# Patient Record
Sex: Female | Born: 1990 | Race: Black or African American | Hispanic: No | Marital: Single | Smoking: Never smoker
Health system: Southern US, Community
[De-identification: ages and names within clinical notes are randomized; demographics above are authoritative.]

## PROBLEM LIST (undated history)

## (undated) DIAGNOSIS — B029 Zoster without complications: Secondary | ICD-10-CM

## (undated) DIAGNOSIS — N390 Urinary tract infection, site not specified: Secondary | ICD-10-CM

## (undated) DIAGNOSIS — E669 Obesity, unspecified: Secondary | ICD-10-CM

## (undated) DIAGNOSIS — E282 Polycystic ovarian syndrome: Secondary | ICD-10-CM

---
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
## 2010-03-28 LAB — URINE MICROSCOPIC-ADD ON

## 2010-03-28 LAB — URINALYSIS, ROUTINE W REFLEX MICROSCOPIC
Glucose, UA: NEGATIVE mg/dL
Hgb urine dipstick: NEGATIVE
Specific Gravity, Urine: 1.02 (ref 1.005–1.030)

## 2010-04-19 LAB — URINALYSIS, ROUTINE W REFLEX MICROSCOPIC
Bilirubin Urine: NEGATIVE
Glucose, UA: NEGATIVE mg/dL
Hgb urine dipstick: NEGATIVE
Ketones, ur: NEGATIVE mg/dL
Nitrite: NEGATIVE
Nitrite: NEGATIVE
Protein, ur: NEGATIVE mg/dL
Urobilinogen, UA: 0.2 mg/dL (ref 0.0–1.0)
pH: 7.5 (ref 5.0–8.0)

## 2010-04-19 LAB — PREGNANCY, URINE
Preg Test, Ur: NEGATIVE
Preg Test, Ur: NEGATIVE

## 2010-04-19 LAB — URINE MICROSCOPIC-ADD ON

## 2010-06-06 ENCOUNTER — Emergency Department (HOSPITAL_BASED_OUTPATIENT_CLINIC_OR_DEPARTMENT_OTHER)
Admission: EM | Admit: 2010-06-06 | Discharge: 2010-06-06 | Disposition: A | Discharge status: Home / Self Care | Payer: Managed Care, Other (non HMO) | Attending: Emergency Medicine | Admitting: Emergency Medicine

## 2010-06-06 DIAGNOSIS — H9209 Otalgia, unspecified ear: Secondary | ICD-10-CM | POA: Insufficient documentation

## 2010-06-06 DIAGNOSIS — H6690 Otitis media, unspecified, unspecified ear: Secondary | ICD-10-CM | POA: Insufficient documentation

## 2011-03-24 ENCOUNTER — Encounter (HOSPITAL_BASED_OUTPATIENT_CLINIC_OR_DEPARTMENT_OTHER): Payer: Self-pay | Admitting: *Deleted

## 2011-03-24 ENCOUNTER — Emergency Department (HOSPITAL_BASED_OUTPATIENT_CLINIC_OR_DEPARTMENT_OTHER)
Admission: EM | Admit: 2011-03-24 | Discharge: 2011-03-24 | Disposition: A | Discharge status: Home / Self Care | Payer: Managed Care, Other (non HMO) | Attending: Emergency Medicine | Admitting: Emergency Medicine

## 2011-03-24 DIAGNOSIS — R599 Enlarged lymph nodes, unspecified: Secondary | ICD-10-CM | POA: Insufficient documentation

## 2011-03-24 DIAGNOSIS — R591 Generalized enlarged lymph nodes: Secondary | ICD-10-CM

## 2011-03-24 MED ORDER — CEPHALEXIN 500 MG PO CAPS: 500.0000 mg | ORAL_CAPSULE | Freq: Four times a day (QID) | ORAL | Status: AC

## 2011-03-24 NOTE — Discharge Instructions (Signed)
Cervical Adenitis You have a swollen lymph gland in your neck. This commonly happens with Strep and virus infections, dental problems, insect bites, and injuries about the face, scalp, or neck. The lymph glands swell as the body fights the infection or heals the injury. Swelling and firmness typically lasts for several weeks after the infection or injury is healed. Rarely lymph glands can become swollen because of cancer or TB. Antibiotics are prescribed if there is evidence of an infection. Sometimes an infected lymph gland becomes filled with pus. This condition may require opening up the abscessed gland by draining it surgically. Most of the time infected glands return to normal within two weeks. Do not poke or squeeze the swollen lymph nodes. That may keep them from shrinking back to their normal size. If the lymph gland is still swollen after 2 weeks, further medical evaluation is needed.  SEEK IMMEDIATE MEDICAL CARE IF:  You have difficulty swallowing or breathing, increased swelling, severe pain, or a high fever.  Document Released: 12/26/2004 Document Revised: 12/15/2010 Document Reviewed: 06/17/2006 [employer] Patient Information 2012 [employer].Lymphadenopathy Lymphadenopathy means "disease of the lymph glands." But the term is usually used to describe swollen or enlarged lymph glands, also called lymph nodes. These are the bean-shaped organs found in many locations including the neck, underarm, and groin. Lymph glands are part of the immune system, which fights infections in your body. Lymphadenopathy can occur in just one area of the body, such as the neck, or it can be generalized, with lymph node enlargement in several areas. The nodes found in the neck are the most common sites of lymphadenopathy. CAUSES  When your immune system responds to germs (such as viruses or bacteria ), infection-fighting cells and fluid build up. This causes the glands to grow in size. This is usually not  something to worry about. Sometimes, the glands themselves can become infected and inflamed. This is called lymphadenitis. Enlarged lymph nodes can be caused by many diseases:  Bacterial disease, such as strep throat or a skin infection.   Viral disease, such as a common cold.   Other germs, such as lyme disease, tuberculosis, or sexually transmitted diseases.   Cancers, such as lymphoma (cancer of the lymphatic system) or leukemia (cancer of the white blood cells).   Inflammatory diseases such as lupus or rheumatoid arthritis.   Reactions to medications.  Many of the diseases above are rare, but important. This is why you should see your caregiver if you have lymphadenopathy. SYMPTOMS   Swollen, enlarged lumps in the neck, back of the head or other locations.   Tenderness.   Warmth or redness of the skin over the lymph nodes.   Fever.  DIAGNOSIS  Enlarged lymph nodes are often near the source of infection. They can help healthcare providers diagnose your illness. For instance:   Swollen lymph nodes around the jaw might be caused by an infection in the mouth.   Enlarged glands in the neck often signal a throat infection.   Lymph nodes that are swollen in more than one area often indicate an illness caused by a virus.  Your caregiver most likely will know what is causing your lymphadenopathy after listening to your history and examining you. Blood tests, x-rays or other tests may be needed. If the cause of the enlarged lymph node cannot be found, and it does not go away by itself, then a biopsy may be needed. Your caregiver will discuss this with you. TREATMENT  Treatment for your   enlarged lymph nodes will depend on the cause. Many times the nodes will shrink to normal size by themselves, with no treatment. Antibiotics or other medicines may be needed for infection. Only take over-the-counter or prescription medicines for pain, discomfort or fever as directed by your caregiver. HOME  CARE INSTRUCTIONS  Swollen lymph glands usually return to normal when the underlying medical condition goes away. If they persist, contact your health-care provider. He/she might prescribe antibiotics or other treatments, depending on the diagnosis. Take any medications exactly as prescribed. Keep any follow-up appointments made to check on the condition of your enlarged nodes.  SEEK MEDICAL CARE IF:   Swelling lasts for more than two weeks.   You have symptoms such as weight loss, night sweats, fatigue or fever that does not go away.   The lymph nodes are hard, seem fixed to the skin or are growing rapidly.   Skin over the lymph nodes is red and inflamed. This could mean there is an infection.  SEEK IMMEDIATE MEDICAL CARE IF:   Fluid starts leaking from the area of the enlarged lymph node.   You develop a fever of 102 F (38.9 C) or greater.   Severe pain develops (not necessarily at the site of a large lymph node).   You develop chest pain or shortness of breath.   You develop worsening abdominal pain.  MAKE SURE YOU:   Understand these instructions.   Will watch your condition.   Will get help right away if you are not doing well or get worse.  Document Released: 10/05/2007 Document Revised: 12/15/2010 Document Reviewed: 10/05/2007 [employer] Patient Information 2012 [employer]. 

## 2011-03-24 NOTE — ED Provider Notes (Signed)
History     CSN: [id number]  Arrival date & time 03/24/11  4098   First MD Initiated Contact with Patient 03/24/11 0246      Chief Complaint  Patient presents with  . knots behind left ear     (Consider location/radiation/quality/duration/timing/severity/associated sxs/prior treatment) HPI Comments: Patient was at work today when she felt too painful nodules behind her left ear. The pain as a 4/10 and worse with palpation. It does not radiate. Today is the first day she noticed these areas. She denies recent sore throat, cough, fever or other upper respiratory symptoms. She states she otherwise feels fine.  No prior medical history and she takes no medications.  The history is provided by the patient.    History reviewed. No pertinent past medical history.  History reviewed. No pertinent past surgical history.  History reviewed. No pertinent family history.  History  Substance Use Topics  . Smoking status: Never Smoker   . Smokeless tobacco: Not on file  . Alcohol Use: No    OB History    Grav Para Term Preterm Abortions TAB SAB Ect Mult Living                  Review of Systems  Constitutional: Negative for fever, fatigue and unexpected weight change.  HENT: Negative for ear pain, congestion, sore throat, rhinorrhea, mouth sores and dental problem.   Respiratory: Negative for cough.   Cardiovascular: Negative for chest pain.  Gastrointestinal: Negative for nausea, vomiting and diarrhea.  Genitourinary: Negative for dysuria.  All other systems reviewed and are negative.    Allergies  Review of patient's allergies indicates not on file.  Home Medications  No current outpatient prescriptions on file.  BP 162/83  Pulse 58  Temp(Src) 98.3 F (36.8 C) (Oral)  Resp 18  SpO2 100%  LMP 01/24/2011  Physical Exam  Nursing note and vitals reviewed. Constitutional: She is oriented to person, place, and time. She appears well-developed and well-nourished. No  distress.  HENT:  Head: Normocephalic and atraumatic.    Right Ear: Tympanic membrane and ear canal normal.  Left Ear: Tympanic membrane and ear canal normal.  Mouth/Throat: Oropharynx is clear and moist and mucous membranes are normal. No oropharyngeal exudate, posterior oropharyngeal edema or posterior oropharyngeal erythema.  Eyes: EOM are normal. Pupils are equal, round, and reactive to light.  Neck: Normal range of motion. Neck supple.  Cardiovascular: Normal rate, normal heart sounds and intact distal pulses.   Pulmonary/Chest: Effort normal and breath sounds normal. No respiratory distress. She has no wheezes. She has no rales.  Musculoskeletal: She exhibits no edema and no tenderness.       No axillary or supraclavicular nodes palpated  Lymphadenopathy:    She has no cervical adenopathy.  Neurological: She is alert and oriented to person, place, and time.  Skin: Skin is warm and dry. No rash noted. No erythema.    ED Course  Procedures (including critical care time)  Labs Reviewed - No data to display No results found.   1. Lymphadenopathy       MDM   Patient with lymphadenopathy in the postauricular region without axillary or supraclavicular nodes present. Patient has no other symptoms. No signs of otitis or pharyngitis. Patient denies recent cough or cold. Will give Keflex and have the patient followup if nodes do not resolve in 2 weeks.        Gwyneth Sprout, MD 03/24/11 [phone]

## 2011-03-24 NOTE — ED Notes (Signed)
Pt states that she noticed two knots behind left ear today knots are painful to touch denies recent illness

## 2011-05-24 ENCOUNTER — Emergency Department (HOSPITAL_BASED_OUTPATIENT_CLINIC_OR_DEPARTMENT_OTHER)
Admission: EM | Admit: 2011-05-24 | Discharge: 2011-05-24 | Disposition: A | Discharge status: Home / Self Care | Payer: Managed Care, Other (non HMO) | Attending: Emergency Medicine | Admitting: Emergency Medicine

## 2011-05-24 ENCOUNTER — Encounter (HOSPITAL_BASED_OUTPATIENT_CLINIC_OR_DEPARTMENT_OTHER): Payer: Self-pay | Admitting: *Deleted

## 2011-05-24 DIAGNOSIS — H109 Unspecified conjunctivitis: Secondary | ICD-10-CM | POA: Insufficient documentation

## 2011-05-24 MED ORDER — CIPROFLOXACIN HCL 0.3 % OP SOLN
2.0000 [drp] | OPHTHALMIC | Status: DC
  Administered 2011-05-24: 2 [drp] via OPHTHALMIC
  Filled 2011-05-24: qty 2.5

## 2011-05-24 NOTE — ED Notes (Signed)
Pt c/o bil eye redness  X 2 days, hx contact use

## 2011-05-24 NOTE — Discharge Instructions (Signed)
Conjunctivitis Conjunctivitis is commonly called "pink eye." Conjunctivitis can be caused by bacterial or viral infection, allergies, or injuries. There is usually redness of the lining of the eye, itching, discomfort, and sometimes discharge. There may be deposits of matter along the eyelids. A viral infection usually causes a watery discharge, while a bacterial infection causes a yellowish, thick discharge. Pink eye is very contagious and spreads by direct contact. You may be given antibiotic eyedrops as part of your treatment. Before using your eye medicine, remove all drainage from the eye by washing gently with warm water and cotton balls. Continue to use the medication until you have awakened 2 mornings in a row without discharge from the eye. Do not rub your eye. This increases the irritation and helps spread infection. Use separate towels from other household members. Wash your hands with soap and water before and after touching your eyes. Use cold compresses to reduce pain and sunglasses to relieve irritation from light. Do not wear contact lenses or wear eye makeup until the infection is gone. SEEK MEDICAL CARE IF:   Your symptoms are not better after 3 days of treatment.   You have increased pain or trouble seeing.   The outer eyelids become very red or swollen.  Document Released: 02/03/2004 Document Revised: 12/15/2010 Document Reviewed: 12/26/2004 [employer] Patient Information 2012 [employer]. 

## 2011-05-24 NOTE — ED Provider Notes (Signed)
History     CSN: [id number]  Arrival date & time 05/24/11  1502   First MD Initiated Contact with Patient 05/24/11 1513      Chief Complaint  Patient presents with  . Eye Problem    (Consider location/radiation/quality/duration/timing/severity/associated sxs/prior treatment) HPI Pt reports onset of bilateral itching watery eyes last night, associated with drainage and crusting while asleep. She wears contact lenses, but has not had them in since last night. No acute visual changes, no trauma. She is otherwise feeling well.   History reviewed. No pertinent past medical history.  History reviewed. No pertinent past surgical history.  History reviewed. No pertinent family history.  History  Substance Use Topics  . Smoking status: Never Smoker   . Smokeless tobacco: Not on file  . Alcohol Use: No    OB History    Grav Para Term Preterm Abortions TAB SAB Ect Mult Living                  Review of Systems No fever, URI symptoms  Allergies  Review of patient's allergies indicates no known allergies.  Home Medications  No current outpatient prescriptions on file.  BP 156/90  Pulse 88  Temp(Src) 98.7 F (37.1 C) (Oral)  Resp 18  Ht 5\' 4"  (1.626 m)  Wt 220 lb (99.791 kg)  BMI 37.76 kg/m2  SpO2 99%  Physical Exam  Constitutional: She is oriented to person, place, and time. She appears well-developed and well-nourished.  HENT:  Head: Normocephalic and atraumatic.  Eyes: Pupils are equal, round, and reactive to light. Right eye exhibits discharge. Left eye exhibits discharge.       Bilateral conjunctival injection and chemosis, anterior chamber is clear bilaterally  Neck: Neck supple.  Pulmonary/Chest: Effort normal.  Neurological: She is alert and oriented to person, place, and time. No cranial nerve deficit.  Psychiatric: She has a normal mood and affect. Her behavior is normal.    ED Course  Procedures (including critical care time)  Labs Reviewed - No  data to display No results found.   No diagnosis found.    MDM  Pt with conjunctivitis, contact lens wearer, given Cipro drops, advised to throw away current contacts and not wear them until symptoms have cleared.         Hollan Philipp B. Bernette Mayers, MD 05/24/11 [phone]

## 2011-06-28 ENCOUNTER — Emergency Department (HOSPITAL_BASED_OUTPATIENT_CLINIC_OR_DEPARTMENT_OTHER)
Admission: EM | Admit: 2011-06-28 | Discharge: 2011-06-29 | Disposition: A | Discharge status: Home / Self Care | Payer: Managed Care, Other (non HMO) | Attending: Emergency Medicine | Admitting: Emergency Medicine

## 2011-06-28 ENCOUNTER — Encounter (HOSPITAL_BASED_OUTPATIENT_CLINIC_OR_DEPARTMENT_OTHER): Payer: Self-pay | Admitting: *Deleted

## 2011-06-28 DIAGNOSIS — B379 Candidiasis, unspecified: Secondary | ICD-10-CM

## 2011-06-28 DIAGNOSIS — N760 Acute vaginitis: Secondary | ICD-10-CM | POA: Insufficient documentation

## 2011-06-28 DIAGNOSIS — B9689 Other specified bacterial agents as the cause of diseases classified elsewhere: Secondary | ICD-10-CM | POA: Insufficient documentation

## 2011-06-28 DIAGNOSIS — A499 Bacterial infection, unspecified: Secondary | ICD-10-CM | POA: Insufficient documentation

## 2011-06-28 NOTE — ED Notes (Signed)
Pt c/o ? Yeast infection x 2 days

## 2011-06-29 LAB — WET PREP, GENITAL: Trich, Wet Prep: NONE SEEN

## 2011-06-29 LAB — URINALYSIS, ROUTINE W REFLEX MICROSCOPIC
Bilirubin Urine: NEGATIVE
Glucose, UA: NEGATIVE mg/dL
Hgb urine dipstick: NEGATIVE
Ketones, ur: NEGATIVE mg/dL
Specific Gravity, Urine: 1.028 (ref 1.005–1.030)
pH: 6.5 (ref 5.0–8.0)

## 2011-06-29 MED ORDER — METRONIDAZOLE 500 MG PO TABS: 500.0000 mg | ORAL_TABLET | Freq: Two times a day (BID) | ORAL | Status: AC

## 2011-06-29 MED ORDER — FLUCONAZOLE 150 MG PO TABS: 150.0000 mg | ORAL_TABLET | Freq: Once | ORAL | Status: AC

## 2011-06-29 NOTE — ED Notes (Signed)
MD at bedside. 

## 2011-06-29 NOTE — ED Provider Notes (Signed)
History     CSN: [id number]  Arrival date & time 06/28/11  2351   First MD Initiated Contact with Patient 06/29/11 0127      Chief Complaint  Patient presents with  . Vaginal Discharge    (Consider location/radiation/quality/duration/timing/severity/associated sxs/prior treatment) HPI  C/o vaginal discharge x 2 days. Itching and white. Denies foul odor. Sexually active in monogamous relationship. Denies h/o STI. Denies abdominal pain, back pain. Denies hematuria/dysuria/freq/urgency. Denies fever/chills. No other complaints today.   ED Notes, ED Provider Notes from 06/28/11 0000 to 06/28/11 23:56:44       Darlene Duos, RN 06/28/2011 23:55      Pt c/o ? Yeast infection x 2 days    History reviewed. No pertinent past medical history.  History reviewed. No pertinent past surgical history.  History reviewed. No pertinent family history.  History  Substance Use Topics  . Smoking status: Never Smoker   . Smokeless tobacco: Not on file  . Alcohol Use: No    OB History    Grav Para Term Preterm Abortions TAB SAB Ect Mult Living                  Review of Systems  All other systems reviewed and are negative.   except as noted HPI   Allergies  Shellfish allergy  Home Medications   Current Outpatient Rx  Name Route Sig Dispense Refill  . FLUCONAZOLE 150 MG PO TABS Oral Take 1 tablet (150 mg total) by mouth once. 1 tablet 1  . METRONIDAZOLE 500 MG PO TABS Oral Take 1 tablet (500 mg total) by mouth 2 (two) times daily. 14 tablet 0    BP 155/74  Pulse 78  Temp 98.1 F (36.7 C) (Oral)  Resp 18  Ht 5\' 4"  (1.626 m)  Wt 220 lb (99.791 kg)  BMI 37.76 kg/m2  SpO2 100%  LMP 04/26/2011  Physical Exam  Nursing note and vitals reviewed. Constitutional: She is oriented to person, place, and time. She appears well-developed.  HENT:  Head: Atraumatic.  Mouth/Throat: Oropharynx is clear and moist.  Eyes: Conjunctivae and EOM are normal. Pupils are equal,  round, and reactive to light.  Neck: Normal range of motion. Neck supple.  Cardiovascular: Normal rate, regular rhythm, normal heart sounds and intact distal pulses.   Pulmonary/Chest: Effort normal and breath sounds normal. No respiratory distress. She has no wheezes. She has no rales.  Abdominal: Soft. She exhibits no distension. There is no tenderness. There is no rebound and no guarding.  Genitourinary: Vaginal discharge found.       White vaginal discharge No CMT  Musculoskeletal: Normal range of motion.  Neurological: She is alert and oriented to person, place, and time.  Skin: Skin is warm and dry. No rash noted.  Psychiatric: She has a normal mood and affect.    ED Course  Procedures (including critical care time)  Labs Reviewed  URINALYSIS, ROUTINE W REFLEX MICROSCOPIC - Abnormal; Notable for the following:    APPearance CLOUDY (*)     Leukocytes, UA LARGE (*)     All other components within normal limits  WET PREP, GENITAL - Abnormal; Notable for the following:    Yeast Wet Prep HPF POC FEW (*)     Clue Cells Wet Prep HPF POC FEW (*)     WBC, Wet Prep HPF POC TOO NUMEROUS TO COUNT (*)  MANY BACTERIA SEEN   All other components within normal limits  URINE MICROSCOPIC-ADD ON - Abnormal;  Notable for the following:    Bacteria, UA MANY (*)     All other components within normal limits  PREGNANCY, URINE  GC/CHLAMYDIA PROBE AMP, GENITAL   No results found.   1. Vaginitis   2. BV (bacterial vaginosis)   3. Yeast infection       MDM  Vaginal discharge. She has both yeast and clue cells on her wet prep. We'll treat for both. Urinalysis appears consistent with urinary tract infection however patient has no signs or symptoms suggesting urinary tract infection given the significant amount of vaginal discharge that she's having I believe a sample to be contaminated seen. She does not have a significant amount of squamous cells. She given prescription for fluconazole, Flagyl.  No EMC precluding discharge at this time. Given Precautions for return. PMD f/u.         Forbes Cellar, MD 06/29/11 [phone]

## 2011-06-29 NOTE — Discharge Instructions (Signed)
Bacterial Vaginosis Bacterial vaginosis (BV) is a vaginal infection where the normal balance of bacteria in the vagina is disrupted. The normal balance is then replaced by an overgrowth of certain bacteria. There are several different kinds of bacteria that can cause BV. BV is the most common vaginal infection in women of childbearing age. CAUSES   The cause of BV is not fully understood. BV develops when there is an increase or imbalance of harmful bacteria.   Some activities or behaviors can upset the normal balance of bacteria in the vagina and put women at increased risk including:   Having a new sex partner or multiple sex partners.   Douching.   Using an intrauterine device (IUD) for contraception.   It is not clear what role sexual activity plays in the development of BV. However, women that have never had sexual intercourse are rarely infected with BV.  Women do not get BV from toilet seats, bedding, swimming pools or from touching objects around them.  SYMPTOMS   Grey vaginal discharge.   A fish-like odor with discharge, especially after sexual intercourse.   Itching or burning of the vagina and vulva.   Burning or pain with urination.   Some women have no signs or symptoms at all.  DIAGNOSIS  Your caregiver must examine the vagina for signs of BV. Your caregiver will perform lab tests and look at the sample of vaginal fluid through a microscope. They will look for bacteria and abnormal cells (clue cells), a pH test higher than 4.5, and a positive amine test all associated with BV.  RISKS AND COMPLICATIONS   Pelvic inflammatory disease (PID).   Infections following gynecology surgery.   Developing HIV.   Developing herpes virus.  TREATMENT  Sometimes BV will clear up without treatment. However, all women with symptoms of BV should be treated to avoid complications, especially if gynecology surgery is planned. Female partners generally do not need to be treated. However,  BV may spread between female sex partners so treatment is helpful in preventing a recurrence of BV.   BV may be treated with antibiotics. The antibiotics come in either pill or vaginal cream forms. Either can be used with nonpregnant or pregnant women, but the recommended dosages differ. These antibiotics are not harmful to the baby.   BV can recur after treatment. If this happens, a second round of antibiotics will often be prescribed.   Treatment is important for pregnant women. If not treated, BV can cause a premature delivery, especially for a pregnant woman who had a premature birth in the past. All pregnant women who have symptoms of BV should be checked and treated.   For chronic reoccurrence of BV, treatment with a type of prescribed gel vaginally twice a week is helpful.  HOME CARE INSTRUCTIONS   Finish all medication as directed by your caregiver.   Do not have sex until treatment is completed.   Tell your sexual partner that you have a vaginal infection. They should see their caregiver and be treated if they have problems, such as a mild rash or itching.   Practice safe sex. Use condoms. Only have 1 sex partner.  PREVENTION  Basic prevention steps can help reduce the risk of upsetting the natural balance of bacteria in the vagina and developing BV:  Do not have sexual intercourse (be abstinent).   Do not douche.   Use all of the medicine prescribed for treatment of BV, even if the signs and symptoms go away.     Tell your sex partner if you have BV. That way, they can be treated, if needed, to prevent reoccurrence.  SEEK MEDICAL CARE IF:   Your symptoms are not improving after 3 days of treatment.   You have increased discharge, pain, or fever.  MAKE SURE YOU:   Understand these instructions.   Will watch your condition.   Will get help right away if you are not doing well or get worse.  FOR MORE INFORMATION  Division of STD Prevention (DSTDP), Centers for Disease  Control and Prevention: SolutionApps.co.za American Social Health Association (ASHA): www.ashastd.org  Document Released: 12/26/2004 Document Revised: 12/15/2010 Document Reviewed: 06/18/2008 [facility] Patient Information 2012 [place], [place].  Vaginitis Vaginitis in a soreness, swelling and redness (inflammation) of the vagina and vulva. This is not a sexually transmitted infection.  CAUSES  Yeast vaginitis is caused by yeast (candida) that is normally found in your vagina. With a yeast infection, the candida has over grown in number to a point that upsets the chemical balance. SYMPTOMS   White thick vaginal discharge.   Swelling, itching, redness and irritation of the vagina and possibly the lips of the vagina (vulva).   Burning or painful urination.   Painful intercourse.  HOME CARE INSTRUCTIONS   Finish all medication as prescribed.   Do not have sex until treatment is completed or instructed by your healthcare giver.   Take warm sitz baths.   Do not douche.   Do not use tampons, especially scented ones.   Wear cotton underwear.   Avoid tight pants and panty hose.   Tell your sexual partner that you have a yeast infection. They should go to their caregiver if they have symptoms such as mild rash or itching.   Your sexual partner should be treated if your infection is difficult to eliminate.   Practice safer sex. Use condoms.   Some vaginal medications cause latex condoms to fail. Ask your caregiver this.  SEEK MEDICAL CARE IF:   You develop a fever.   The infection is getting worse after 2 days of treatment.   The infection is not getting better after 3 days of treatment.   You develop blisters in or around your vagina.   You develop vaginal bleeding, and it is not your menstrual period.   You have pain when you urinate.   You develop intestinal problems.   You have pain with sexual intercourse.  Document Released: 02/03/2004 Document Revised: 12/15/2010  Document Reviewed: 09/10/2008 [facility] Patient Information 2012 [place], [place].  RESOURCE GUIDE  Dental Problems  Patients with Medicaid: [facility] [id number]                                           [phone] [address] Phone:  [phone]                                                   Phone:  [phone]  If unable to pay or uninsured, contact:  [facility] or [facility]. to become qualified for the adult dental clinic.  Chronic Pain Problems  Contact [facility]  [phone] Patients need to be referred by their primary care doctor.  Insufficient Money for Medicine Contact United Way:  call "211" or [facility] [phone].  No Primary Care Doctor Call [facility]  [phone] Other agencies that provide inexpensive medical care    [facility]  [phone]    [facility]  [phone]    [facility]  [phone]    [facility]  [phone]    [facility]  [phone]    [facility]  [phone]  Psychological Services [facility]  [phone] [facility]  [phone] [facility]   [phone] (emergency services [phone])  Abuse/Neglect [facility] Child Abuse Hotline 519-866-0108 [facility] Child Abuse Hotline [phone] (After Hours)  Emergency Shelter [facility] [phone]  Maternity Homes [facility] at [facility] [place] [facility] [phone] [facility] [phone]  MRSA Hotline #:   (442)356-0636    [facility] Resources  [facility]  United Way                           [facility] Dept. [address] [place]                     [address]         [address]  Blondell Reveal Phone:  [phone]                                   Phone:  [phone]                   Phone:  [phone]  [facility] Phone:  [phone]  [facility] Child Abuse Hotline [phone] (After Hours)

## 2011-06-29 NOTE — ED Notes (Signed)
Pelvic cart to bedside 

## 2011-06-30 LAB — GC/CHLAMYDIA PROBE AMP, GENITAL
Chlamydia, DNA Probe: NEGATIVE
GC Probe Amp, Genital: NEGATIVE

## 2011-11-08 ENCOUNTER — Emergency Department (HOSPITAL_BASED_OUTPATIENT_CLINIC_OR_DEPARTMENT_OTHER)
Admission: EM | Admit: 2011-11-08 | Discharge: 2011-11-08 | Disposition: A | Discharge status: Home / Self Care | Payer: Self-pay | Attending: Emergency Medicine | Admitting: Emergency Medicine

## 2011-11-08 ENCOUNTER — Encounter (HOSPITAL_BASED_OUTPATIENT_CLINIC_OR_DEPARTMENT_OTHER): Payer: Self-pay | Admitting: *Deleted

## 2011-11-08 DIAGNOSIS — N644 Mastodynia: Secondary | ICD-10-CM | POA: Insufficient documentation

## 2011-11-08 DIAGNOSIS — Z3202 Encounter for pregnancy test, result negative: Secondary | ICD-10-CM | POA: Insufficient documentation

## 2011-11-08 LAB — PREGNANCY, URINE: Preg Test, Ur: NEGATIVE

## 2011-11-08 MED ORDER — IBUPROFEN 800 MG PO TABS
800.0000 mg | ORAL_TABLET | Freq: Once | ORAL | Status: AC
  Administered 2011-11-08: 800 mg via ORAL
  Filled 2011-11-08: qty 1

## 2011-11-08 MED ORDER — IBUPROFEN 800 MG PO TABS: 800.0000 mg | ORAL_TABLET | Freq: Three times a day (TID) | ORAL | Status: DC

## 2011-11-08 NOTE — ED Provider Notes (Signed)
Medical screening examination/treatment/procedure(s) were performed by non-physician practitioner and as supervising physician I was immediately available for consultation/collaboration.  Geoffery Lyons, MD 11/08/11 [phone]

## 2011-11-08 NOTE — ED Notes (Signed)
Pt c/o bil breast pain x  3 months

## 2011-11-08 NOTE — ED Notes (Signed)
Pt c/o bilateral breast pain x 3 months. Pt reports pain is worse on sides of breast. Pt denies any discharge. No abnormalities noted on assessment.

## 2011-11-08 NOTE — ED Provider Notes (Signed)
History     CSN: [id number]  Arrival date & time 11/08/11  1318   First MD Initiated Contact with Patient 11/08/11 1330      Chief Complaint  Patient presents with  . breast pain     (Consider location/radiation/quality/duration/timing/severity/associated sxs/prior treatment) HPI Comments: Patient is a 21 year old female who presents with a 3 month history of intermittent bilateral breast pain. The pain is located in both breasts and does radiate to her midaxillary line and underneath her breasts. The pain is described as aching and moderate. The pain starts suddenly and spontaneously resolves without intervention. No alleviating/aggravating factors. The patient has tried nothing for symptoms without relief. Associated symptoms include nothing. Patient denies current breast feeding. Patient denies fever, headache, NVD, chest pain, SOB, dysuria, constipation, nipple discharge, breast mass, breast skin changes, abnormal vaginal bleeding/discharge. LMP 09/25/2011    History reviewed. No pertinent past medical history.  History reviewed. No pertinent past surgical history.  History reviewed. No pertinent family history.  History  Substance Use Topics  . Smoking status: Never Smoker   . Smokeless tobacco: Not on file  . Alcohol Use: No    OB History    Grav Para Term Preterm Abortions TAB SAB Ect Mult Living                  Review of Systems  Constitutional:       Breast pain.   All other systems reviewed and are negative.    Allergies  Shellfish allergy  Home Medications  No current outpatient prescriptions on file.  BP 155/65  Pulse 78  Temp 98.9 F (37.2 C) (Oral)  Resp 16  Ht 5\' 5"  (1.651 m)  Wt 230 lb (104.327 kg)  BMI 38.27 kg/m2  SpO2 100%  LMP 09/25/2011  Physical Exam  Nursing note and vitals reviewed. Constitutional: She is oriented to person, place, and time. She appears well-developed and well-nourished. No distress.  HENT:  Head:  Normocephalic and atraumatic.  Nose: Nose normal.  Eyes: Conjunctivae normal and EOM are normal. Pupils are equal, round, and reactive to light. No scleral icterus.  Neck: Normal range of motion. Neck supple.  Cardiovascular: Normal rate and regular rhythm.  Exam reveals no gallop and no friction rub.   No murmur heard. Pulmonary/Chest: Effort normal and breath sounds normal. She has no wheezes. She has no rales. She exhibits tenderness.       Bilateral breasts mildly tender to palpation on exam. No abnormal lumps or masses palpated. Breasts equal in size and texture bilaterally. No nipple discharge noted. No enlarged lymph nodes palpated.   Abdominal: Soft. She exhibits no distension. There is no tenderness. There is no rebound and no guarding.  Musculoskeletal: Normal range of motion.  Neurological: She is alert and oriented to person, place, and time. Coordination normal.       Speech is goal-oriented. Moves limbs without ataxia.   Skin: Skin is warm and dry. She is not diaphoretic.  Psychiatric: She has a normal mood and affect. Her behavior is normal.    ED Course  Procedures (including critical care time)   Labs Reviewed  PREGNANCY, URINE   No results found.   1. Breast pain       MDM  1:57 PM Urine preg negative. Patient having intermittent breast tenderness unrelated to her menstrual cycle. Patient unsure of family history of breast cancer. No concerning findings on exam. Patient will have ibuprofen here and go home with a prescription  for ibuprofen and follow up with OBGYN. No further evaluation needed here at this time.        Emilia Beck, PA-C 11/08/11 1407

## 2012-05-24 ENCOUNTER — Encounter (HOSPITAL_BASED_OUTPATIENT_CLINIC_OR_DEPARTMENT_OTHER): Payer: Self-pay | Admitting: *Deleted

## 2012-05-24 ENCOUNTER — Emergency Department (HOSPITAL_BASED_OUTPATIENT_CLINIC_OR_DEPARTMENT_OTHER)
Admission: EM | Admit: 2012-05-24 | Discharge: 2012-05-24 | Disposition: A | Discharge status: Home / Self Care | Payer: No Typology Code available for payment source | Attending: Emergency Medicine | Admitting: Emergency Medicine

## 2012-05-24 ENCOUNTER — Emergency Department (HOSPITAL_BASED_OUTPATIENT_CLINIC_OR_DEPARTMENT_OTHER): Payer: No Typology Code available for payment source

## 2012-05-24 DIAGNOSIS — M79602 Pain in left arm: Secondary | ICD-10-CM

## 2012-05-24 DIAGNOSIS — M79609 Pain in unspecified limb: Secondary | ICD-10-CM | POA: Insufficient documentation

## 2012-05-24 DIAGNOSIS — R0789 Other chest pain: Secondary | ICD-10-CM | POA: Insufficient documentation

## 2012-05-24 DIAGNOSIS — R209 Unspecified disturbances of skin sensation: Secondary | ICD-10-CM | POA: Insufficient documentation

## 2012-05-24 LAB — TROPONIN I: Troponin I: <0.3 ng/mL (ref <0.30)

## 2012-05-24 MED ORDER — CYCLOBENZAPRINE HCL 10 MG PO TABS: 10.0000 mg | ORAL_TABLET | Freq: Two times a day (BID) | ORAL | Status: DC | PRN

## 2012-05-24 NOTE — ED Notes (Signed)
Patient states she has a three day history of intermittent left hand and finger tingling.  States when she lays on her right side, she has left chest pain.  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

## 2012-05-24 NOTE — ED Provider Notes (Signed)
12:22 PM  Date: 05/24/2012  Rate: 65  Rhythm: normal sinus rhythm and sinus arrhythmia  QRS Axis: normal  Intervals: normal  ST/T Wave abnormalities: normal  Conduction Disutrbances:none  Narrative Interpretation: Normal EKG  Old EKG Reviewed: none available    Carleene Cooper III, MD 05/24/12 1223

## 2012-05-24 NOTE — ED Provider Notes (Signed)
History     CSN: [id number]  Arrival date & time 05/24/12  1153   First MD Initiated Contact with Patient 05/24/12 1314      Chief Complaint  Patient presents with  . Arm Pain    (Consider location/radiation/quality/duration/timing/severity/associated sxs/prior treatment) HPI Comments: Darlene Ayala is a 22 y/o F presenting to the ED with left arm pain. 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 Described discomfort to be of a "dead arm," stated that the arm feels heavy and aching, occurs intermittently with episodes lasting 2 minutes and occurring every 15 minutes. Patient reported that she has been experiencing left hand tingling every 15 minutes that last approximately 2 minutes. Stated that she pciked up a heavy patient approximately 2 weeks ago - thinks that it might be from that incident. Patient reported that when she lays on her right side she has chest pain that is localized to the left side of the chest - described as an aching pain that is intermittent and associated with left arm discomfort. Patient reported that nothing makes the discomfort better, nothing makes the discomfort worse. Denied weakness to the arm and being unable to hold objects. Denied neck pain, back pain, shoulder pain, injury, trauma, headache, dizziness, fatigue, shortness of breathe, difficulty breathing.     Patient is a 22 y.o. female presenting with arm pain. The history is provided by the patient. No language interpreter was used.  Arm Pain Associated symptoms include arthralgias (left arm discomfort) and chest pain. Pertinent negatives include no abdominal pain, chills, coughing, fatigue, fever, headaches, nausea, neck pain, numbness, rash, sore throat, vomiting or weakness.    History reviewed. No pertinent past medical history.  History reviewed. No pertinent past surgical history.  No family history  on file.  History  Substance Use Topics  . Smoking status: Never Smoker   . Smokeless tobacco: Not on file  . Alcohol Use: No    OB History   Grav Para Term Preterm Abortions TAB SAB Ect Mult Living                  Review of Systems  Constitutional: Negative for fever, chills and fatigue.  HENT: Negative for ear pain, sore throat, trouble swallowing, neck pain and neck stiffness.   Eyes: Negative for photophobia, pain and visual disturbance.  Respiratory: Negative for cough, chest tightness and shortness of breath.   Cardiovascular: Positive for chest pain.  Gastrointestinal: Negative for nausea, vomiting, abdominal pain, diarrhea and constipation.  Genitourinary: Negative for dysuria, hematuria, decreased urine volume and difficulty urinating.  Musculoskeletal: Positive for arthralgias (left arm discomfort). Negative for back pain.  Skin: Negative for rash and wound.  Neurological: Negative for dizziness, weakness, light-headedness, numbness and headaches.  All other systems reviewed and are negative.    Allergies  Shellfish allergy  Home Medications   Current Outpatient Rx  Name  Route  Sig  Dispense  Refill  . cyclobenzaprine (FLEXERIL) 10 MG tablet   Oral   Take 1 tablet (10 mg total) by mouth 2 (two) times daily as needed for muscle spasms.   20 tablet   0   . ibuprofen (ADVIL,MOTRIN) 800 MG tablet   Oral   Take 1 tablet (800 mg total) by mouth 3 (three) times daily.   21 tablet   0     BP 137/70  Temp(Src) 98.6 F (37 C) (Oral)  Resp 18  SpO2 99%  Physical Exam  Nursing note and vitals reviewed. Constitutional: She is oriented to person, place, and time. She appears well-developed and well-nourished. No distress.  HENT:  Head: Normocephalic and atraumatic.  Mouth/Throat: Oropharynx is clear and moist. No oropharyngeal exudate.  Uvula midline, symmetrical elevation  Eyes: Conjunctivae and EOM are normal. Pupils are equal, round, and reactive to  light. Right eye exhibits no discharge. Left eye exhibits no discharge.  Neck: Normal range of motion. Neck supple. No tracheal deviation present.  Negative nuchal rigidity Negative neck stiffness Negative lymphadenopathy  Cardiovascular: Normal rate, regular rhythm and normal heart sounds.  Exam reveals no friction rub.   No murmur heard. Radial pulses 2+ bilaterally Pedal pulses 2+ bilaterally Negative leg and ankle swelling  Pulmonary/Chest: Effort normal and breath sounds normal. No respiratory distress. She has no wheezes. She has no rales. She exhibits no tenderness.  Musculoskeletal: Normal range of motion. She exhibits no edema and no tenderness.       Left shoulder: Normal. She exhibits normal range of motion, no tenderness, no bony tenderness, no swelling, no effusion, no deformity, no pain and normal strength.       Left elbow: Normal. She exhibits normal range of motion, no swelling, no effusion and no deformity. No tenderness found.       Left wrist: Normal. She exhibits normal range of motion, no tenderness, no bony tenderness, no swelling, no effusion, no crepitus, no deformity and no laceration.  Full ROM to upper extremities bilaterally Strength 5+/5+ to upper extremities bilaterally  Lymphadenopathy:    She has no cervical adenopathy.  Neurological: She is alert and oriented to person, place, and time. She displays no atrophy. No cranial nerve deficit or sensory deficit. She exhibits normal muscle tone. Coordination normal. GCS eye subscore is 4. GCS verbal subscore is 5. GCS motor subscore is 6.  Cranial nerves III-XII grossly intact Sensation to upper extremities intact with differentiation to sharp and dull sensation bilaterally  Skin: Skin is warm and dry. No rash noted. She is not diaphoretic. No erythema.  Psychiatric: She has a normal mood and affect. Her behavior is normal. Thought content normal.    ED Course  Procedures (including critical care time)    Date:  05/24/2012  Rate: 65  Rhythm: normal sinus rhythm and sinus arrhythmia  QRS Axis: normal  Intervals: normal  ST/T Wave abnormalities: normal  Conduction Disutrbances:none  Narrative Interpretation:   Old EKG Reviewed: none available   Labs Reviewed  TROPONIN I   Dg Cervical Spine Complete  05/24/2012   *RADIOLOGY REPORT*  Clinical Data: Left arm pain 2-3 days.  No known injury.  CERVICAL SPINE - COMPLETE 4+ VIEW  Comparison:  None  Findings:  Mild reversal of the normal cervical lordotic curve could be positional or due to spasm. There is no prevertebral soft tissue swelling.  There is no foraminal narrowing.  Lung apices are clear.  Odontoid unremarkable.  IMPRESSION: Mild loss of the normal cervical lordosis, otherwise negative.   Original Report Authenticated By: Davonna Belling, M.D.     1. Left arm pain       MDM  Patient afebrile, normotensive, non-tachycardic, alert and oriented. EKG negative for STEMI/NSTEMI. Troponin negative findings. Cervical spine mild loss of cervical lordosis - negative degenerative findings. Patient aspetic, non-toxic appearing, in no acute distress. Discharged patient with small dose of muscle relaxers. Discussed with patient course. Discussed with patient to rest and stay hydrated. Discussed with  patient to avoid strenuous activity. Referred to orthopedics. Resource guide given. Discussed with patient to monitor symptoms and if symptoms are to worsen or change to report back to the ED. Patient agreed to plan of care, understood, all questions answered.  Patient left before work note could be given.         [employer], PA-C 05/25/12 0045

## 2012-05-25 NOTE — ED Provider Notes (Signed)
Medical screening examination/treatment/procedure(s) were performed by non-physician practitioner and as supervising physician I was immediately available for consultation/collaboration.   Carleene Cooper III, MD 05/25/12 1325

## 2012-11-11 ENCOUNTER — Emergency Department (HOSPITAL_BASED_OUTPATIENT_CLINIC_OR_DEPARTMENT_OTHER)
Admission: EM | Admit: 2012-11-11 | Discharge: 2012-11-12 | Disposition: A | Discharge status: Home / Self Care | Payer: PRIVATE HEALTH INSURANCE | Attending: Emergency Medicine | Admitting: Emergency Medicine

## 2012-11-11 ENCOUNTER — Encounter (HOSPITAL_BASED_OUTPATIENT_CLINIC_OR_DEPARTMENT_OTHER): Payer: Self-pay | Admitting: Emergency Medicine

## 2012-11-11 DIAGNOSIS — IMO0002 Reserved for concepts with insufficient information to code with codable children: Secondary | ICD-10-CM | POA: Insufficient documentation

## 2012-11-11 DIAGNOSIS — L02412 Cutaneous abscess of left axilla: Secondary | ICD-10-CM

## 2012-11-11 DIAGNOSIS — L0201 Cutaneous abscess of face: Secondary | ICD-10-CM | POA: Insufficient documentation

## 2012-11-11 DIAGNOSIS — L03211 Cellulitis of face: Secondary | ICD-10-CM | POA: Insufficient documentation

## 2012-11-11 NOTE — ED Notes (Signed)
EDP Dr. Wilkie Aye into room

## 2012-11-11 NOTE — ED Notes (Signed)
Pt c/o abscess to face and left axillary

## 2012-11-11 NOTE — ED Provider Notes (Signed)
CSN: [id number]     Arrival date & time 11/11/12  2341 History   None    This chart was scribed for Darlene Baton, MD by Arlan Organ, ED Scribe. This patient was seen in room MH10/MH10 and the patient's care was started 12:00 AM.   Chief Complaint  Patient presents with  . Abscess   The history is provided by the patient. No language interpreter was used.   HPI Comments: Darlene Ayala is a 22 y.o. Female with a hx of abscesses who presents to the Emergency Department complaining of new abscesses on her facial area and left axillary that started 4 days ago. Pt states she has noted drainage in the abscess on her face, but is experiencing discomfort in her axillary area. Pt denies any dental pain. Pt denies fever, CP, or SOB. She reports noticing a previous abscess about 1 week ago on her lip which was not treated. Pt states she is allergic to seafood. She denies any other medical problems.  History reviewed. No pertinent past medical history. History reviewed. No pertinent past surgical history. History reviewed. No pertinent family history. History  Substance Use Topics  . Smoking status: Never Smoker   . Smokeless tobacco: Not on file  . Alcohol Use: No   OB History   Grav Para Term Preterm Abortions TAB SAB Ect Mult Living                 Review of Systems  Constitutional: Negative for fever.  HENT: Negative for sore throat.   Skin: Negative for color change.       Abscess   All other systems reviewed and are negative.    Allergies  Shellfish allergy  Home Medications   Current Outpatient Rx  Name  Route  Sig  Dispense  Refill  . cyclobenzaprine (FLEXERIL) 10 MG tablet   Oral   Take 1 tablet (10 mg total) by mouth 2 (two) times daily as needed for muscle spasms.   20 tablet   0   . ibuprofen (ADVIL,MOTRIN) 800 MG tablet   Oral   Take 1 tablet (800 mg total) by mouth 3 (three) times daily.   21 tablet   0    BP 160/71  Pulse 98  Temp(Src) 99.1 F  (37.3 C) (Oral)  Resp 16  Ht 5\' 5"  (1.651 m)  Wt 240 lb (108.863 kg)  BMI 39.94 kg/m2  SpO2 100% Physical Exam  Nursing note and vitals reviewed. Constitutional: She is oriented to person, place, and time. She appears well-developed and well-nourished. No distress.  HENT:  Head: Normocephalic and atraumatic.  Mouth/Throat: Oropharynx is clear and moist.  1 cm area of induration in the left mandible with pustule. Spontaneous drainage noted. There is no evidence of erythema and warmth. Patient does have mild swelling over the left mandible but does not extend into the submandibular region.  No evidence of dental caries or abscess  Neck: Neck supple.  Cardiovascular: Normal rate, regular rhythm and normal heart sounds.   Pulmonary/Chest: Effort normal. No respiratory distress.  Abdominal: Soft. There is no tenderness.  Lymphadenopathy:    She has no cervical adenopathy.  Neurological: She is alert and oriented to person, place, and time.  Skin: Skin is warm and dry.  Facial abscess as described above. There is a 2 cm x 1 cm area of fluctuance and induration of the left axilla. No associated erythema or warmth.  Psychiatric: She has a normal mood and affect.  ED Course  Procedures (including critical care time) DIAGNOSTIC STUDIES: Oxygen Saturation is 100% on RA, Normal by my interpretation.    COORDINATION OF CARE: 12:03 AM- Will perform an I&D. Discussed treatment plan with pt at bedside and pt agreed to plan.       Labs Review Labs Reviewed - No data to display Imaging Review No results found.  EKG Interpretation   None      INCISION AND DRAINAGE Performed by: Ross Marcus, F Consent: Verbal consent obtained. Risks and benefits: risks, benefits and alternatives were discussed Type: abscess  Body area: left axilla  Anesthesia: local infiltration  Incision was made with a scalpel.  Local anesthetic: lidocaine 2% w epinephrine  Anesthetic total: 6  ml  Complexity: complex Blunt dissection to break up loculations  Drainage: purulent  Drainage amount: 2 cc  Packing material: 1/4 in iodoform gauze  Patient tolerance: Patient tolerated the procedure well with no immediate complications.    MDM   1. Abscess of axilla, left   2. Abscess of face    This is a 22 year old female who presents with an abscess to the left face and left axilla. She is nontoxic-appearing and has been afebrile. She is no evidence of cellulitis. The abscess of the left face is spontaneously draining. Bedside ultrasound is not drainable abscess at this time. Patient has no evidence of Ludwigs or deep tissue infection at this time.  Abscess of the left axilla was drained and packed. Patient was encouraged to followup in 2 days for wound check. At this time antibiotics are not indicated as the patient does not have any evidence of cellulitis. Patient will be discharged home. She was given strict return precautions particularly regarding the facial abscess. If she has increasing warmth, swelling, fever, difficulty opening her mouth or increasing symptoms she should return for recheck.  After history, exam, and medical workup I feel the patient has been appropriately medically screened and is safe for discharge home. Pertinent diagnoses were discussed with the patient. Patient was given return precautions.   I personally performed the services described in this documentation, which was scribed in my presence. The recorded information has been reviewed and is accurate.   Darlene Baton, MD 11/12/12 [phone]

## 2012-11-12 NOTE — ED Notes (Signed)
Dr. Wilkie Aye at [facility] with [place] for I&D of L axillary abscess. Pt alert, NAD, calm, interactive, participatory and tolerating procedure, family at [facility]. EDP into room prior to RN assessment, see MD notes, pending orders.

## 2012-11-12 NOTE — ED Notes (Signed)
L axillary abscess I&D continues, tolerating well, no changes, alert, NAD, calm, family present.

## 2013-01-26 ENCOUNTER — Emergency Department (HOSPITAL_BASED_OUTPATIENT_CLINIC_OR_DEPARTMENT_OTHER)
Admission: EM | Admit: 2013-01-26 | Discharge: 2013-01-26 | Disposition: A | Discharge status: Home / Self Care | Payer: PRIVATE HEALTH INSURANCE | Attending: Emergency Medicine | Admitting: Emergency Medicine

## 2013-01-26 ENCOUNTER — Encounter (HOSPITAL_BASED_OUTPATIENT_CLINIC_OR_DEPARTMENT_OTHER): Payer: Self-pay | Admitting: Emergency Medicine

## 2013-01-26 DIAGNOSIS — M545 Low back pain, unspecified: Secondary | ICD-10-CM | POA: Insufficient documentation

## 2013-01-26 DIAGNOSIS — Z3202 Encounter for pregnancy test, result negative: Secondary | ICD-10-CM | POA: Insufficient documentation

## 2013-01-26 DIAGNOSIS — Z791 Long term (current) use of non-steroidal anti-inflammatories (NSAID): Secondary | ICD-10-CM | POA: Insufficient documentation

## 2013-01-26 LAB — URINALYSIS, ROUTINE W REFLEX MICROSCOPIC
Bilirubin Urine: NEGATIVE
GLUCOSE, UA: NEGATIVE mg/dL
HGB URINE DIPSTICK: NEGATIVE
KETONES UR: NEGATIVE mg/dL
Leukocytes, UA: NEGATIVE
Nitrite: NEGATIVE
PROTEIN: NEGATIVE mg/dL
Specific Gravity, Urine: 1.024 (ref 1.005–1.030)
Urobilinogen, UA: 0.2 mg/dL (ref 0.0–1.0)
pH: 6.5 (ref 5.0–8.0)

## 2013-01-26 LAB — PREGNANCY, URINE: Preg Test, Ur: NEGATIVE

## 2013-01-26 MED ORDER — ACETAMINOPHEN 500 MG PO TABS
1000.0000 mg | ORAL_TABLET | Freq: Once | ORAL | Status: AC
  Administered 2013-01-26: 1000 mg via ORAL
  Filled 2013-01-26: qty 2

## 2013-01-26 NOTE — Discharge Instructions (Signed)
Take Tylenol as directed every 4 hours for pain. Call the [facility] at [phone] tomorrow to schedule the next available appointment. He should schedule an appointment to get a Pap smear, GYN exam and breast exam. Your blood pressure should be rechecked within the next 3 weeks. Today's was elevated at 155/71

## 2013-01-26 NOTE — ED Notes (Signed)
MD with pt  

## 2013-01-26 NOTE — ED Provider Notes (Signed)
CSN: [id number]     Arrival date & time 01/26/13  [id number] History   First MD Initiated Contact with Patient 01/26/13 [phone]     Chief Complaint  Patient presents with  . Back Pain   (Consider location/radiation/quality/duration/timing/severity/associated sxs/prior Treatment) HPI Complains of right-sided paralumbar back pain onset 6 days ago. Pain is nonradiating nothing makes symptoms better or worse not affected by movement no urinary symptoms no fever no abdominal pain no other associated symptoms no treatment prior to coming here no incontinence. Pain is mild at present, dull in quality History reviewed. No pertinent past medical history. past medical history negative History reviewed. No pertinent past surgical history. surgical history negative History reviewed. No pertinent family history. History  Substance Use Topics  . Smoking status: Never Smoker   . Smokeless tobacco: Not on file  . Alcohol Use: No   OB History   Grav Para Term Preterm Abortions TAB SAB Ect Mult Living                 Review of Systems  Constitutional: Negative.   HENT: Negative.   Respiratory: Negative.   Cardiovascular: Negative.   Gastrointestinal: Negative.   Genitourinary:       Menses are regular. Last menstrual period 5 months ago Bilateral breast pain intermittent, sharp for 3 months. Last pain was 3 days ago.  Musculoskeletal: Positive for back pain.  Skin: Negative.   Neurological: Negative.   Psychiatric/Behavioral: Negative.   All other systems reviewed and are negative.    Allergies  Shellfish allergy  Home Medications   Current Outpatient Rx  Name  Route  Sig  Dispense  Refill  . cyclobenzaprine (FLEXERIL) 10 MG tablet   Oral   Take 1 tablet (10 mg total) by mouth 2 (two) times daily as needed for muscle spasms.   20 tablet   0   . ibuprofen (ADVIL,MOTRIN) 800 MG tablet   Oral   Take 1 tablet (800 mg total) by mouth 3 (three) times daily.   21 tablet   0    BP 154/74   Pulse 58  Temp(Src) 98.1 F (36.7 C) (Oral)  Resp 20  Ht 5\' 5"  (1.651 m)  Wt 245 lb (111.131 kg)  BMI 40.77 kg/m2  SpO2 100%  LMP 10/26/2012 Physical Exam  Nursing note and vitals reviewed. Constitutional: She is oriented to person, place, and time. She appears well-developed and well-nourished.  HENT:  Head: Normocephalic and atraumatic.  Eyes: Conjunctivae are normal. Pupils are equal, round, and reactive to light.  Neck: Neck supple. No tracheal deviation present. No thyromegaly present.  Cardiovascular: Normal rate and regular rhythm.   No murmur heard. Pulmonary/Chest: Effort normal and breath sounds normal.  Bilateral breasts without mass.  Redness or tendernessNipples normal. No axillary nodes  Abdominal: Soft. Bowel sounds are normal. She exhibits no distension. There is no tenderness.  Obese  Musculoskeletal: Normal range of motion. She exhibits no edema and no tenderness.  Entire spine nontender. No flank tenderness  Neurological: She is alert and oriented to person, place, and time. No cranial nerve deficit. Coordination normal.  Gait normal  Skin: Skin is warm and dry. No rash noted.  Psychiatric: She has a normal mood and affect.    ED Course  Procedures (including critical care time) Labs Review Labs Reviewed  URINALYSIS, ROUTINE W REFLEX MICROSCOPIC  PREGNANCY, URINE   Imaging Review No results found.  EKG Interpretation   None       MDM  No diagnosis found. Etiology pain is unclear however mild. No definitive physical exam findings. Plan Tylenol for pain referral women's as clinic. Blood pressure recheck 3 weeks Diagnosis #1 low back pain #2 breast pain #3 elevated blood pressure     Doug Sou, MD 01/26/13 [phone]

## 2013-01-26 NOTE — ED Notes (Addendum)
Pt reports Right lower back pain that started a week ago, states "I think I have a kidney infection," denies other s/s. Pt also c/o intermittent pain to bilateral breasts.

## 2013-12-09 ENCOUNTER — Emergency Department (HOSPITAL_BASED_OUTPATIENT_CLINIC_OR_DEPARTMENT_OTHER)
Admission: EM | Admit: 2013-12-09 | Discharge: 2013-12-10 | Disposition: A | Discharge status: Home / Self Care | Payer: No Typology Code available for payment source | Attending: Emergency Medicine | Admitting: Emergency Medicine

## 2013-12-09 ENCOUNTER — Encounter (HOSPITAL_BASED_OUTPATIENT_CLINIC_OR_DEPARTMENT_OTHER): Payer: Self-pay | Admitting: *Deleted

## 2013-12-09 DIAGNOSIS — K224 Dyskinesia of esophagus: Secondary | ICD-10-CM | POA: Diagnosis not present

## 2013-12-09 DIAGNOSIS — Z791 Long term (current) use of non-steroidal anti-inflammatories (NSAID): Secondary | ICD-10-CM | POA: Diagnosis not present

## 2013-12-09 DIAGNOSIS — Z3202 Encounter for pregnancy test, result negative: Secondary | ICD-10-CM | POA: Insufficient documentation

## 2013-12-09 DIAGNOSIS — K2970 Gastritis, unspecified, without bleeding: Secondary | ICD-10-CM | POA: Diagnosis not present

## 2013-12-09 DIAGNOSIS — R12 Heartburn: Secondary | ICD-10-CM | POA: Diagnosis present

## 2013-12-09 NOTE — ED Provider Notes (Signed)
CSN: [id number]     Arrival date & time 12/09/13  2324 History  This chart was scribed for Loren Raceravid Emira Eubanks, MD by Gwenyth Oberatherine Macek, ED Scribe. This patient was seen in room MH12/MH12 and the patient's care was started at 11:45 PM.    Chief Complaint  Patient presents with  . Heartburn   The history is provided by the patient. No language interpreter was used.    HPI Comments: Darlene Ayala is a 23 y.o. female who presents to the Emergency Department complaining of intermittent, central chest pain and upper abdominal pain that started 4 days ago. She states pain occurs when she is swallowing. She has tried Antacids and Aleve with no relief to symptoms. Pt states that pain does not change with lying flat. She denies a history of similar symptoms and a history of GERD. Pt denies burning sensation and a metallic taste as associated symptoms. No fever or chills. No shortness of breath. Currently denies any chest pain but is having epigastric pain.   History reviewed. No pertinent past medical history. History reviewed. No pertinent past surgical history. History reviewed. No pertinent family history. History  Substance Use Topics  . Smoking status: Never Smoker   . Smokeless tobacco: Not on file  . Alcohol Use: No   OB History    No data available     Review of Systems  Constitutional: Negative for fever and chills.  Respiratory: Negative for chest tightness.   Cardiovascular: Positive for chest pain. Negative for palpitations and leg swelling.  Gastrointestinal: Positive for abdominal pain. Negative for nausea, vomiting, diarrhea and blood in stool.  Musculoskeletal: Negative for back pain, neck pain and neck stiffness.  Skin: Negative for rash and wound.  Neurological: Negative for dizziness, weakness and numbness.  All other systems reviewed and are negative.     Allergies  Shellfish allergy  Home Medications   Prior to Admission medications   Medication Sig Start Date End  Date Taking? Authorizing Provider  cyclobenzaprine (FLEXERIL) 10 MG tablet Take 1 tablet (10 mg total) by mouth 2 (two) times daily as needed for muscle spasms. 05/24/12   Marissa Sciacca, PA-C  ibuprofen (ADVIL,MOTRIN) 800 MG tablet Take 1 tablet (800 mg total) by mouth 3 (three) times daily. 11/08/11   Kaitlyn Szekalski, PA-C   BP 161/77 mmHg  Pulse 84  Temp(Src) 98.1 F (36.7 C) (Oral)  Resp 18  Ht 5\' 4"  (1.626 m)  Wt 250 lb (113.399 kg)  BMI 42.89 kg/m2  SpO2 100% Physical Exam  Constitutional: She is oriented to person, place, and time. She appears well-developed and well-nourished. No distress.  HENT:  Head: Normocephalic and atraumatic.  Mouth/Throat: Oropharynx is clear and moist. No oropharyngeal exudate.  Eyes: EOM are normal. Pupils are equal, round, and reactive to light.  Neck: Normal range of motion. Neck supple.  Cardiovascular: Normal rate and regular rhythm.   Pulmonary/Chest: Effort normal and breath sounds normal. No respiratory distress. She has no wheezes. She has no rales. She exhibits no tenderness.  Abdominal: Soft. Bowel sounds are normal. She exhibits no distension and no mass. There is tenderness (tenderness to palpation in the epigastric region.). There is no rebound and no guarding.  Musculoskeletal: Normal range of motion. She exhibits no edema or tenderness.  Neurological: She is alert and oriented to person, place, and time.  Skin: Skin is warm and dry. No rash noted. No erythema.  Psychiatric: She has a normal mood and affect. Her behavior is normal.  Nursing  note and vitals reviewed.   ED Course  Procedures (including critical care time) DIAGNOSTIC STUDIES: Oxygen Saturation is 100% on RA, normal by my interpretation.    COORDINATION OF CARE: 11:50 AM Discussed treatment plan which includes labwork and pt agreed to plan.  Labs Review Labs Reviewed  COMPREHENSIVE METABOLIC PANEL - Abnormal; Notable for the following:    Total Protein 8.5 (*)     Total Bilirubin <0.2 (*)    All other components within normal limits  CBC WITH DIFFERENTIAL  LIPASE, BLOOD  URINALYSIS, ROUTINE W REFLEX MICROSCOPIC  PREGNANCY, URINE    Imaging Review No results found.   EKG Interpretation None      MDM   Final diagnoses:  Gastritis  Esophageal dysmotility   I personally performed the services described in this documentation, which was scribed in my presence. The recorded information has been reviewed and is accurate.  Patient's epigastric tenderness is completely resolved after GI cocktail. Abdomen remained soft. She continues to have no chest pain. Will start on trial PPI. Question reflux versus esophageal dysmotility as a cause for the patient's pain when swallowing. Advised to drink small amounts slowly and eat slowly. If she continues to have symptoms she will need follow-up with gastroenterology is. She's been given return precautions and has voice understanding.    Loren Raceravid Shuree Brossart, MD 12/10/13 [phone]

## 2013-12-09 NOTE — ED Notes (Signed)
Pt c/o upper gastric pain while and after eating and drinking x 4 days

## 2013-12-10 LAB — COMPREHENSIVE METABOLIC PANEL
ALBUMIN: 4.2 g/dL (ref 3.5–5.2)
ALT: 16 U/L (ref 0–35)
ANION GAP: 13 (ref 5–15)
AST: 19 U/L (ref 0–37)
Alkaline Phosphatase: 70 U/L (ref 39–117)
BUN: 12 mg/dL (ref 6–23)
CALCIUM: 9.7 mg/dL (ref 8.4–10.5)
CHLORIDE: 102 meq/L (ref 96–112)
CO2: 26 mEq/L (ref 19–32)
CREATININE: 0.8 mg/dL (ref 0.50–1.10)
GFR calc Af Amer: >90 mL/min (ref >90)
GFR calc non Af Amer: >90 mL/min (ref >90)
Glucose, Bld: 91 mg/dL (ref 70–99)
Potassium: 4.1 mEq/L (ref 3.7–5.3)
Sodium: 141 mEq/L (ref 137–147)
Total Bilirubin: <0.2 mg/dL — ABNORMAL LOW (ref 0.3–1.2)
Total Protein: 8.5 g/dL — ABNORMAL HIGH (ref 6.0–8.3)

## 2013-12-10 LAB — CBC WITH DIFFERENTIAL/PLATELET
BASOS PCT: 0 % (ref 0–1)
Basophils Absolute: 0 10*3/uL (ref 0.0–0.1)
EOS PCT: 1 % (ref 0–5)
Eosinophils Absolute: 0.1 10*3/uL (ref 0.0–0.7)
HCT: 40.7 % (ref 36.0–46.0)
HEMOGLOBIN: 13.4 g/dL (ref 12.0–15.0)
Lymphocytes Relative: 40 % (ref 12–46)
Lymphs Abs: 2.7 10*3/uL (ref 0.7–4.0)
MCH: 29.1 pg (ref 26.0–34.0)
MCHC: 32.9 g/dL (ref 30.0–36.0)
MCV: 88.5 fL (ref 78.0–100.0)
MONO ABS: 0.5 10*3/uL (ref 0.1–1.0)
MONOS PCT: 7 % (ref 3–12)
Neutro Abs: 3.5 10*3/uL (ref 1.7–7.7)
Neutrophils Relative %: 52 % (ref 43–77)
Platelets: 318 10*3/uL (ref 150–400)
RBC: 4.6 MIL/uL (ref 3.87–5.11)
RDW: 13.3 % (ref 11.5–15.5)
WBC: 6.8 10*3/uL (ref 4.0–10.5)

## 2013-12-10 LAB — URINALYSIS, ROUTINE W REFLEX MICROSCOPIC
Bilirubin Urine: NEGATIVE
Glucose, UA: NEGATIVE mg/dL
Hgb urine dipstick: NEGATIVE
KETONES UR: NEGATIVE mg/dL
Leukocytes, UA: NEGATIVE
NITRITE: NEGATIVE
Protein, ur: NEGATIVE mg/dL
SPECIFIC GRAVITY, URINE: 1.023 (ref 1.005–1.030)
UROBILINOGEN UA: 0.2 mg/dL (ref 0.0–1.0)
pH: 6.5 (ref 5.0–8.0)

## 2013-12-10 LAB — PREGNANCY, URINE: Preg Test, Ur: NEGATIVE

## 2013-12-10 LAB — LIPASE, BLOOD: LIPASE: 23 U/L (ref 11–59)

## 2013-12-10 MED ORDER — GI COCKTAIL ~~LOC~~
30.0000 mL | Freq: Once | ORAL | Status: AC
  Administered 2013-12-10: 30 mL via ORAL
  Filled 2013-12-10: qty 30

## 2013-12-10 MED ORDER — LANSOPRAZOLE 30 MG PO CPDR: 30.0000 mg | DELAYED_RELEASE_CAPSULE | Freq: Every day | ORAL | Status: DC

## 2013-12-10 NOTE — Discharge Instructions (Signed)
Esophageal Spasm Esophageal spasm is an uncoordinated contraction of the muscles of the esophagus (the tube which carries food from your mouth to your stomach). Normally, the muscles of the esophagus alternate between contraction and relaxation starting from the top of the esophagus and working down to the bottom. This moves the food from the mouth to the stomach. In esophageal spasm, all the muscles contract at once. This causes pain and fails to move the food along. As a result, you may have trouble swallowing.  Women are more likely than men to have esophageal spasm. The cause of the spasms is not known. Sometimes eating hot or cold foods triggers the condition and this may be due to an overly sensitive esophagus. This is not an infectious disease and cannot be passed to others. SYMPTOMS  Symptoms of esophageal spasm may include: chest pain, burning or pain with swallowing, and difficulty swallowing.  DIAGNOSIS  Esophageal spasm can be diagnosed by a test called manometry (pressure studies of the esophagus). In this test, a special tube is inserted down the esophagus. The tube measures the muscle activity of the esophagus. Abnormal contractions mixed with normal movement helps confirm the diagnosis.  A person with a hypersensitive esophagus may be diagnosed by inflating a long balloon in the person's esophagus. If this causes the same symptoms, preventive methods may work. PREVENTION  Avoid hot or cold foods if that seems to be a trigger. PROGNOSIS  This condition does not go away, nor is treatment entirely satisfactory. Patients need to be careful of what they eat. They need to continue on medication if a useful one is found. Fortunately, the condition does not get progressively worse as time passes. Esophageal spasm does not usually lead to more serious problems but sometimes the pain can be disabling. If a person becomes afraid to eat they may become malnourished and lose weight.  TREATMENT   A  procedure in which instruments of increasing size are inserted through the esophagus to enlarge (dilate) it are used.  Medications that decrease acid-production of the stomach may be used such as proton-pump inhibitors or H2-blockers.  Medications of several types can be used to relax the muscles of the esophagus.  An individual with a hypersensitive esophagus sometimes improves with low doses of medications normally used for depression.  No treatment for esophageal spasm is effective for everyone. Often several approaches will be tried before one works. In many cases, the symptoms will improve, but will not go away completely.  For severe cases, relief is obtained two-thirds of the time by cutting the muscles along the entire length of the esophagus. This is a major surgical procedure.  Your symptoms are usually the best guide to how well the treatment for esophageal spasm works. SIDE EFFECTS OF TREATMENTS  Nitrates can cause headaches and low blood pressure.  Calcium channel blockers can cause:  Feeling sick to your stomach (nausea).  Constipation and other side effects.  Antidepressants can cause side effects that depend on the medication used. HOME CARE INSTRUCTIONS   Let your caregiver know if problems are getting worse, or if you get food stuck in your esophagus for longer than 1 hour or as directed and are unable to swallow liquid.  Take medications as directed and with permission of your caregiver. Ask about what to do if a medication seems to get stuck in your esophagus. Only take over-the-counter or prescription medicines for pain, discomfort, or fever as directed by your caregiver.  Soft and liquid foods  pass more easily than solid pieces. SEEK IMMEDIATE MEDICAL CARE IF:   You develop severe chest pain, especially if the pain is crushing or pressure-like and spreads to the arms, back, neck, or jaw, or if you have sweating, nausea, or shortness of breath. THIS COULD BE AN  EMERGENCY. Do not wait to see if the pain will go away. Get medical help at once. Call 911 or 0 (operator). DO NOT drive yourself to the hospital.  Your chest pain gets worse and does not go away with rest.  You have an attack of chest pain lasting longer than usual despite rest and treatment with the medications your physician has prescribed.  You wake from sleep with chest pain or shortness of breath.  You feel dizzy or faint.  You have chest pain, not typical of your usual pain, caused by your esophagus for which you originally saw your caregiver. MAKE SURE YOU:   Understand these instructions.  Will watch your condition.  Will get help right away if you are not doing well or get worse. Document Released: 03/18/2002 Document Revised: 03/20/2011 Document Reviewed: 03/21/2013 [facility] Patient Information 2015 [employer], [place]. This information is not intended to replace advice given to you by your health care provider. Make sure you discuss any questions you have with your health care provider.  Gastritis, Adult Gastritis is soreness and swelling (inflammation) of the lining of the stomach. Gastritis can develop as a sudden onset (acute) or long-term (chronic) condition. If gastritis is not treated, it can lead to stomach bleeding and ulcers. CAUSES  Gastritis occurs when the stomach lining is weak or damaged. Digestive juices from the stomach then inflame the weakened stomach lining. The stomach lining may be weak or damaged due to viral or bacterial infections. One common bacterial infection is the Helicobacter pylori infection. Gastritis can also result from excessive alcohol consumption, taking certain medicines, or having too much acid in the stomach.  SYMPTOMS  In some cases, there are no symptoms. When symptoms are present, they may include:  Pain or a burning sensation in the upper abdomen.  Nausea.  Vomiting.  An uncomfortable feeling of fullness after eating. DIAGNOSIS    Your caregiver may suspect you have gastritis based on your symptoms and a physical exam. To determine the cause of your gastritis, your caregiver may perform the following:  Blood or stool tests to check for the H pylori bacterium.  Gastroscopy. A thin, flexible tube (endoscope) is passed down the esophagus and into the stomach. The endoscope has a light and camera on the end. Your caregiver uses the endoscope to view the inside of the stomach.  Taking a tissue sample (biopsy) from the stomach to examine under a microscope. TREATMENT  Depending on the cause of your gastritis, medicines may be prescribed. If you have a bacterial infection, such as an H pylori infection, antibiotics may be given. If your gastritis is caused by too much acid in the stomach, H2 blockers or antacids may be given. Your caregiver may recommend that you stop taking aspirin, ibuprofen, or other nonsteroidal anti-inflammatory drugs (NSAIDs). HOME CARE INSTRUCTIONS  Only take over-the-counter or prescription medicines as directed by your caregiver.  If you were given antibiotic medicines, take them as directed. Finish them even if you start to feel better.  Drink enough fluids to keep your urine clear or pale yellow.  Avoid foods and drinks that make your symptoms worse, such as:  Caffeine or alcoholic drinks.  Chocolate.  Peppermint or  mint flavorings.  Garlic and onions.  Spicy foods.  Citrus fruits, such as oranges, lemons, or limes.  Tomato-based foods such as sauce, chili, salsa, and pizza.  Fried and fatty foods.  Eat small, frequent meals instead of large meals. SEEK IMMEDIATE MEDICAL CARE IF:   You have black or dark red stools.  You vomit blood or material that looks like coffee grounds.  You are unable to keep fluids down.  Your abdominal pain gets worse.  You have a fever.  You do not feel better after 1 week.  You have any other questions or concerns. MAKE SURE  YOU:  Understand these instructions.  Will watch your condition.  Will get help right away if you are not doing well or get worse. Document Released: 12/20/2000 Document Revised: 06/27/2011 Document Reviewed: 02/08/2011 [facility]Exit[facility] Patient Information 2015 [place]Care, [place]LLC. This information is not intended to replace advice given to you by your health care provider. Make sure you discuss any questions you have with your health care provider.

## 2014-01-30 ENCOUNTER — Encounter (HOSPITAL_BASED_OUTPATIENT_CLINIC_OR_DEPARTMENT_OTHER): Payer: Self-pay | Admitting: Emergency Medicine

## 2014-01-30 ENCOUNTER — Emergency Department (HOSPITAL_BASED_OUTPATIENT_CLINIC_OR_DEPARTMENT_OTHER): Payer: PRIVATE HEALTH INSURANCE

## 2014-01-30 ENCOUNTER — Emergency Department (HOSPITAL_BASED_OUTPATIENT_CLINIC_OR_DEPARTMENT_OTHER)
Admission: EM | Admit: 2014-01-30 | Discharge: 2014-01-30 | Disposition: A | Discharge status: Home / Self Care | Payer: PRIVATE HEALTH INSURANCE | Attending: Emergency Medicine | Admitting: Emergency Medicine

## 2014-01-30 DIAGNOSIS — M79671 Pain in right foot: Secondary | ICD-10-CM | POA: Insufficient documentation

## 2014-01-30 DIAGNOSIS — R52 Pain, unspecified: Secondary | ICD-10-CM

## 2014-01-30 DIAGNOSIS — Z791 Long term (current) use of non-steroidal anti-inflammatories (NSAID): Secondary | ICD-10-CM | POA: Insufficient documentation

## 2014-01-30 MED ORDER — HYDROCODONE-ACETAMINOPHEN 5-325 MG PO TABS: 1.0000 | ORAL_TABLET | Freq: Four times a day (QID) | ORAL | Status: DC | PRN

## 2014-01-30 NOTE — ED Notes (Signed)
C/o R heel pain, ongoing for 2 months, worse with standing walking pressure, (denies: numbness, tingling, radiation, injury, fever, wounds or sores, or other sx), has tried aleve, CMS & ROM intact.

## 2014-01-30 NOTE — ED Provider Notes (Signed)
CSN: [id number]     Arrival date & time 01/30/14  0042 History   First MD Initiated Contact with Patient 01/30/14 0254     Chief Complaint  Patient presents with  . 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 She is on her feet a lot at work. She is here with pain in her right anterior heel that has been present for 2 months. It is worse with standing and walking. She has been self-medicating with Aleve without adequate relief. She denies any injury to that heel. Her pain is moderate to severe at its worst. It is relieved with rest. There is no associated deformity or swelling.  History reviewed. No pertinent past medical history. History reviewed. No pertinent past surgical history. No family history on file. History  Substance Use Topics  . Smoking status: Never Smoker   . Smokeless tobacco: Not on file  . Alcohol Use: No   OB History    No data available     Review of Systems  All other systems reviewed and are negative.   Allergies  Shellfish allergy  Home Medications   Prior to Admission medications   Medication Sig Start Date End Date Taking? Authorizing Provider  cyclobenzaprine (FLEXERIL) 10 MG tablet Take 1 tablet (10 mg total) by mouth 2 (two) times daily as needed for muscle spasms. 05/24/12   Marissa Sciacca, PA-C  ibuprofen (ADVIL,MOTRIN) 800 MG tablet Take 1 tablet (800 mg total) by mouth 3 (three) times daily. 11/08/11   Kaitlyn Szekalski, PA-C  lansoprazole (PREVACID) 30 MG capsule Take 1 capsule (30 mg total) by mouth daily at 12 noon. 12/10/13   Loren Raceravid Yelverton, MD   BP 157/77 mmHg  Pulse 84  Temp(Src) 98.3 F (36.8 C) (Oral)  Resp 18  Ht 5\' 6"  (1.676 m)  Wt 250 lb (113.399 kg)  BMI 40.37 kg/m2  SpO2 100%  LMP 01/16/2014 (Approximate)   Physical Exam  General: Well-developed, well-nourished female in no acute distress; appearance  consistent with age of record HENT: normocephalic; atraumatic Eyes: normal appearance Neck: supple Heart: regular rate and rhythm Lungs: normal respiratory effort and excursion Abdomen: soft; nondistended Extremities: No deformity; full range of motion; pulses normal; tenderness of right anterior heel without deformity, swelling, erythema or skin breakdown Neurologic: Awake, alert and oriented; motor function intact in all extremities and symmetric; no facial droop Skin: Warm and dry Psychiatric: Normal mood and affect    ED Course  Procedures (including critical care time)   MDM  Nursing notes and vitals signs, including pulse oximetry, reviewed.  Summary of this visit's results, reviewed by myself:  Imaging Studies: Dg Os Calcis Right  01/30/2014   CLINICAL DATA:  Right heel pain for 2 months. Pain worse with walking.  EXAM: RIGHT OS CALCIS - 2+ VIEW  COMPARISON:  None.  FINDINGS: There is no evidence of acute or chronic fracture. No heel spur. No radiopaque foreign body. No degenerative changes.  IMPRESSION: Negative.   Electronically Signed   By: Tiburcio PeaJonathan  Watts M.D.   On: 01/30/2014 03:37      Hanley SeamenJohn L Ledarius Leeson, MD 01/30/14 [phone]

## 2014-01-30 NOTE — ED Notes (Signed)
Right heel pain for a couple months.  Hurting more tonight.

## 2014-03-29 ENCOUNTER — Emergency Department (HOSPITAL_BASED_OUTPATIENT_CLINIC_OR_DEPARTMENT_OTHER)
Admission: EM | Admit: 2014-03-29 | Discharge: 2014-03-29 | Disposition: A | Discharge status: Home / Self Care | Payer: No Typology Code available for payment source | Attending: Emergency Medicine | Admitting: Emergency Medicine

## 2014-03-29 DIAGNOSIS — B029 Zoster without complications: Secondary | ICD-10-CM | POA: Diagnosis not present

## 2014-03-29 DIAGNOSIS — Z79899 Other long term (current) drug therapy: Secondary | ICD-10-CM | POA: Diagnosis not present

## 2014-03-29 DIAGNOSIS — Z791 Long term (current) use of non-steroidal anti-inflammatories (NSAID): Secondary | ICD-10-CM | POA: Insufficient documentation

## 2014-03-29 DIAGNOSIS — R21 Rash and other nonspecific skin eruption: Secondary | ICD-10-CM | POA: Diagnosis present

## 2014-03-29 MED ORDER — ACYCLOVIR 400 MG PO TABS: 800.0000 mg | ORAL_TABLET | Freq: Every day | ORAL | Status: DC

## 2014-03-29 NOTE — ED Provider Notes (Signed)
CSN: [id number]     Arrival date & time 03/29/14  1802 History   This chart was scribed for Margarita Grizzleanielle Kamoni Gentles, MD by Abel PrestoKara Demonbreun, ED Scribe. This patient was seen in room MH03/MH03 and the patient's care was started at 7:32 PM.    Chief Complaint  Patient presents with  . Rash     Patient is a 24 y.o. female presenting with rash. The history is provided by the patient. No language interpreter was used.  Rash  HPI Comments: Darlene Ayala is a 24 y.o. female who presents to the Emergency Department complaining of blister-like rash to left flank with onset this morning. 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 Pt is not a smoker and denies EtOH use. Pt notes mild pain, but denies itching.   No past medical history on file. No past surgical history on file. No family history on file. History  Substance Use Topics  . Smoking status: Never Smoker   . Smokeless tobacco: Not on file  . Alcohol Use: No   OB History    No data available     Review of Systems  Skin: Positive for rash.  All other systems reviewed and are negative.     Allergies  Shellfish allergy  Home Medications   Prior to Admission medications   Medication Sig Start Date End Date Taking? Authorizing Provider  cyclobenzaprine (FLEXERIL) 10 MG tablet Take 1 tablet (10 mg total) by mouth 2 (two) times daily as needed for muscle spasms. 05/24/12   Marissa Sciacca, PA-C  HYDROcodone-acetaminophen (NORCO) 5-325 MG per tablet Take 1-2 tablets by mouth every 6 (six) hours as needed (for pain). 01/30/14   John Molpus, MD  ibuprofen (ADVIL,MOTRIN) 800 MG tablet Take 1 tablet (800 mg total) by mouth 3 (three) times daily. 11/08/11   Kaitlyn Szekalski, PA-C  lansoprazole (PREVACID) 30 MG capsule Take 1 capsule (30 mg total) by mouth daily at 12 noon. 12/10/13   Loren Raceravid Yelverton, MD   BP 141/74 mmHg  Temp(Src) 99.1 F (37.3 C) (Oral)  Resp 18  Ht 5\' 6"  (1.676 m)  Wt 240 lb (108.863 kg)  BMI 38.76 kg/m2 Physical Exam   Constitutional: She is oriented to person, place, and time. She appears well-developed and well-nourished.  HENT:  Head: Normocephalic.  Eyes: Conjunctivae are normal.  Neck: Normal range of motion. Neck supple.  Pulmonary/Chest: Effort normal.  Musculoskeletal: Normal range of motion.  Neurological: She is alert and oriented to person, place, and time.  Skin: Skin is warm and dry. Rash noted. Rash is vesicular (left flank, 3 vesicles).  Psychiatric: She has a normal mood and affect. Her behavior is normal.  Nursing note and vitals reviewed.   ED Course  Procedures (including critical care time) DIAGNOSTIC STUDIES:    COORDINATION OF CARE: 7:36 PM Discussed treatment plan with patient at beside, the patient agrees with the plan and has no further questions at this time.   Labs Review Labs Reviewed - No data to display  Imaging Review No results found.   EKG Interpretation None      MDM   Final diagnoses:  Shingles     I personally performed the services described in this documentation, which was scribed in my presence. The recorded information has been reviewed and considered.      Margarita Grizzleanielle Jianni Batten, MD 04/01/14 [phone]

## 2014-03-29 NOTE — ED Notes (Signed)
Pt sts rash on left side of chest wall, concerned it's shingles. One small patch of blisters, weeping on left lateral chest wall.

## 2014-03-29 NOTE — Discharge Instructions (Signed)

## 2014-04-05 ENCOUNTER — Emergency Department (HOSPITAL_BASED_OUTPATIENT_CLINIC_OR_DEPARTMENT_OTHER)
Admission: EM | Admit: 2014-04-05 | Discharge: 2014-04-05 | Disposition: A | Discharge status: Home / Self Care | Payer: No Typology Code available for payment source | Attending: Emergency Medicine | Admitting: Emergency Medicine

## 2014-04-05 ENCOUNTER — Encounter (HOSPITAL_BASED_OUTPATIENT_CLINIC_OR_DEPARTMENT_OTHER): Payer: Self-pay | Admitting: *Deleted

## 2014-04-05 DIAGNOSIS — Z3202 Encounter for pregnancy test, result negative: Secondary | ICD-10-CM | POA: Diagnosis not present

## 2014-04-05 DIAGNOSIS — Z791 Long term (current) use of non-steroidal anti-inflammatories (NSAID): Secondary | ICD-10-CM | POA: Insufficient documentation

## 2014-04-05 DIAGNOSIS — M7918 Myalgia, other site: Secondary | ICD-10-CM

## 2014-04-05 DIAGNOSIS — Z79899 Other long term (current) drug therapy: Secondary | ICD-10-CM | POA: Insufficient documentation

## 2014-04-05 DIAGNOSIS — M791 Myalgia: Secondary | ICD-10-CM | POA: Insufficient documentation

## 2014-04-05 DIAGNOSIS — Z8619 Personal history of other infectious and parasitic diseases: Secondary | ICD-10-CM | POA: Insufficient documentation

## 2014-04-05 DIAGNOSIS — M545 Low back pain: Secondary | ICD-10-CM | POA: Diagnosis present

## 2014-04-05 HISTORY — DX: Zoster without complications: B02.9

## 2014-04-05 LAB — URINALYSIS, ROUTINE W REFLEX MICROSCOPIC
BILIRUBIN URINE: NEGATIVE
GLUCOSE, UA: NEGATIVE mg/dL
HGB URINE DIPSTICK: NEGATIVE
KETONES UR: NEGATIVE mg/dL
LEUKOCYTES UA: NEGATIVE
Nitrite: NEGATIVE
PROTEIN: NEGATIVE mg/dL
Specific Gravity, Urine: 1.007 (ref 1.005–1.030)
Urobilinogen, UA: 0.2 mg/dL (ref 0.0–1.0)
pH: 6.5 (ref 5.0–8.0)

## 2014-04-05 LAB — PREGNANCY, URINE: Preg Test, Ur: NEGATIVE

## 2014-04-05 MED ORDER — IBUPROFEN 800 MG PO TABS
800.0000 mg | ORAL_TABLET | Freq: Once | ORAL | Status: AC
  Administered 2014-04-05: 800 mg via ORAL
  Filled 2014-04-05: qty 1

## 2014-04-05 MED ORDER — IBUPROFEN 800 MG PO TABS: 800.0000 mg | ORAL_TABLET | Freq: Three times a day (TID) | ORAL | Status: DC

## 2014-04-05 NOTE — ED Notes (Signed)
Pt reports seen here recently for rash on L lateral lower rib area and dx with shingles, placed on acyclovir and rash improving.  Several days now of urinary frequency, pain in right lower lumbar area, reproduced with palpation.  Denies dysuria, no pain in right flank area and no additional symptoms.  Denies trauma or lifting injury.  ambulatory without difficulty.

## 2014-04-05 NOTE — ED Notes (Signed)
Recent shingles diagnosis and acyclovir course, rash was on L axillary side, here tonight for developing R lower back pain, onset Friday, mentions temporary relief with cranberry juice. No meds PTA. Mentions frequency. Denies other sx.

## 2014-04-05 NOTE — ED Provider Notes (Signed)
CSN: [id number]     Arrival date & time 04/05/14  1859 History  This chart was scribed for Darlene SheffieldForrest Maurisha Mongeau, MD by Evon Slackerrance Branch, ED Scribe. This patient was seen in room MH05/MH05 and the patient's care was started at 7:45 PM.    Chief Complaint  Patient presents with  . Back Pain   Patient is a 24 y.o. female presenting with back pain. The history is provided by the patient. No language interpreter was used.  Back Pain Location:  Gluteal region Quality: sharp. Radiates to:  Does not radiate Pain severity:  Mild Pain is:  Same all the time Onset quality:  Gradual Duration:  2 days Timing:  Constant Progression:  Unchanged Chronicity:  New Relieved by:  None tried Worsened by:  Nothing tried Ineffective treatments:  None tried Associated symptoms: no abdominal pain, no chest pain, no dysuria, no fever, no headaches, no numbness, no pelvic pain and no weakness    HPI Comments: Darlene Ayala is a 24 y.o. female who presents to the Emergency Department complaining of new sharp low back pain onset 2 days prior. Pt states that the pain is located towards her right buttock. Pt doesn't report any associated symptoms. Pt doesn't report any alleviating or worsening factors. Pt denies any medication PTA. Pt states that she was dx with a shingle 1 week ago and acyclovir. Pt states that she noticed the pain when she stop taking the acyclovir. Pt denies abdominal pain, dysuria, or vaginal discharge.   Past Medical History  Diagnosis Date  . Shingles    History reviewed. No pertinent past surgical history. No family history on file. History  Substance Use Topics  . Smoking status: Never Smoker   . Smokeless tobacco: Not on file  . Alcohol Use: No   OB History    No data available     Review of Systems  Constitutional: Negative for fever, chills, diaphoresis, activity change, appetite change and fatigue.  HENT: Negative for congestion, facial swelling, rhinorrhea and sore throat.    Eyes: Negative for photophobia and discharge.  Respiratory: Negative for cough, chest tightness and shortness of breath.   Cardiovascular: Negative for chest pain, palpitations and leg swelling.  Gastrointestinal: Negative for nausea, vomiting, abdominal pain and diarrhea.  Endocrine: Negative for polydipsia and polyuria.  Genitourinary: Negative for dysuria, frequency, vaginal discharge, difficulty urinating and pelvic pain.  Musculoskeletal: Positive for back pain. Negative for arthralgias, neck pain and neck stiffness.  Skin: Negative for color change and wound.  Allergic/Immunologic: Negative for immunocompromised state.  Neurological: Negative for facial asymmetry, weakness, numbness and headaches.  Hematological: Does not bruise/bleed easily.  Psychiatric/Behavioral: Negative for confusion and agitation.      Allergies  Shellfish allergy  Home Medications   Prior to Admission medications   Medication Sig Start Date End Date Taking? Authorizing Provider  acyclovir (ZOVIRAX) 400 MG tablet Take 2 tablets (800 mg total) by mouth 5 (five) times daily. 03/29/14   Margarita Grizzleanielle Ray, MD  cyclobenzaprine (FLEXERIL) 10 MG tablet Take 1 tablet (10 mg total) by mouth 2 (two) times daily as needed for muscle spasms. 05/24/12   Marissa Sciacca, PA-C  HYDROcodone-acetaminophen (NORCO) 5-325 MG per tablet Take 1-2 tablets by mouth every 6 (six) hours as needed (for pain). 01/30/14   John Molpus, MD  ibuprofen (ADVIL,MOTRIN) 800 MG tablet Take 1 tablet (800 mg total) by mouth 3 (three) times daily. 11/08/11   Kaitlyn Szekalski, PA-C  lansoprazole (PREVACID) 30 MG capsule Take 1 capsule (30  mg total) by mouth daily at 12 noon. 12/10/13   Loren Racer, MD   BP 167/80 mmHg  Pulse 82  Temp(Src) 98.5 F (36.9 C) (Oral)  Resp 18  Ht  (1.676 m)  Wt 240 lb (108.863 kg)  BMI 38.76 kg/m2  SpO2 100%  LMP 02/22/2014   Physical Exam  Constitutional: She is oriented to person, place, and time. She  appears well-developed and well-nourished. No distress.  HENT:  Head: Normocephalic and atraumatic.  Right Ear: Hearing normal.  Left Ear: Hearing normal.  Nose: Nose normal.  Mouth/Throat: Oropharynx is clear and moist and mucous membranes are normal.  Eyes: Conjunctivae and EOM are normal. Pupils are equal, round, and reactive to light.  Neck: Normal range of motion. Neck supple.  Cardiovascular: Regular rhythm, S1 normal and S2 normal.  Exam reveals no gallop and no friction rub.   No murmur heard. Pulmonary/Chest: Effort normal and breath sounds normal. No respiratory distress. She exhibits no tenderness.  Abdominal: Soft. Normal appearance and bowel sounds are normal. There is no hepatosplenomegaly. There is no tenderness. There is no rebound, no guarding, no tenderness at McBurney's point and negative Murphy's sign. No hernia.  Musculoskeletal: Normal range of motion. She exhibits tenderness.  Focal tenderness of right upper buttock. No tenderness of vertebrae noticed.   Neurological: She is alert and oriented to person, place, and time. She has normal strength. No cranial nerve deficit or sensory deficit. Coordination normal. GCS eye subscore is 4. GCS verbal subscore is 5. GCS motor subscore is 6.  Skin: Skin is warm, dry and intact. No rash noted. No cyanosis.  Psychiatric: She has a normal mood and affect. Her speech is normal and behavior is normal. Thought content normal.  Nursing note and vitals reviewed.   ED Course  Procedures (including critical care time) DIAGNOSTIC STUDIES: Oxygen Saturation is 100% on RA, normal by my interpretation.    COORDINATION OF CARE: 8:07 PM-Discussed treatment plan with pt at bedside and pt agreed to plan.     Labs Review Labs Reviewed  PREGNANCY, URINE  URINALYSIS, ROUTINE W REFLEX MICROSCOPIC    Imaging Review No results found.   EKG Interpretation None      MDM   Final diagnoses:  Right buttock pain   8:31 PM 24 y.o.  female here with right upper buttock pain which began 2 days ago. The pains is to be worse with movement and sitting. Urinalysis and U Praeger negative. She is focally tender in the right upper buttock on exam. Likely a muscle strain. Will recommend ice, rest, ibuprofen, and return for any worsening.    I personally performed the services described in this documentation, which was scribed in my presence. The recorded information has been reviewed and is accurate.      Darlene Sheffield, MD 04/05/14 2031

## 2014-04-05 NOTE — ED Notes (Signed)
MD at bedside. 

## 2014-07-22 ENCOUNTER — Encounter (HOSPITAL_BASED_OUTPATIENT_CLINIC_OR_DEPARTMENT_OTHER): Payer: Self-pay | Admitting: *Deleted

## 2014-07-22 DIAGNOSIS — Z791 Long term (current) use of non-steroidal anti-inflammatories (NSAID): Secondary | ICD-10-CM | POA: Insufficient documentation

## 2014-07-22 DIAGNOSIS — Z8619 Personal history of other infectious and parasitic diseases: Secondary | ICD-10-CM | POA: Insufficient documentation

## 2014-07-22 DIAGNOSIS — Z79899 Other long term (current) drug therapy: Secondary | ICD-10-CM | POA: Insufficient documentation

## 2014-07-22 DIAGNOSIS — J029 Acute pharyngitis, unspecified: Secondary | ICD-10-CM | POA: Diagnosis present

## 2014-07-22 NOTE — ED Notes (Signed)
Pt c/o sore throat x2 days 

## 2014-07-23 ENCOUNTER — Emergency Department (HOSPITAL_BASED_OUTPATIENT_CLINIC_OR_DEPARTMENT_OTHER)
Admission: EM | Admit: 2014-07-23 | Discharge: 2014-07-23 | Disposition: A | Discharge status: Home / Self Care | Payer: No Typology Code available for payment source | Attending: Emergency Medicine | Admitting: Emergency Medicine

## 2014-07-23 DIAGNOSIS — J029 Acute pharyngitis, unspecified: Secondary | ICD-10-CM

## 2014-07-23 LAB — RAPID STREP SCREEN (MED CTR MEBANE ONLY): Streptococcus, Group A Screen (Direct): NEGATIVE

## 2014-07-23 MED ORDER — AZITHROMYCIN 500 MG PO TABS: 500.0000 mg | ORAL_TABLET | Freq: Every day | ORAL | Status: DC

## 2014-07-23 MED ORDER — FLUCONAZOLE 150 MG PO TABS: ORAL_TABLET | ORAL | Status: DC

## 2014-07-23 NOTE — ED Provider Notes (Signed)
CSN: [id number]     Arrival date & time 07/22/14  2329 History   First MD Initiated Contact with Patient 07/23/14 0143     Chief Complaint  Patient presents with  . Sore Throat     (Consider location/radiation/quality/duration/timing/severity/associated sxs/prior Treatment) HPI  This is a 24 year old female with a four-day history of sore throat. She states her throat feels "really sore", worse with swallowing. She has not had a fever. She has not had nasal congestion or cough. She does notice white patches in her throat. She is having some anterior lymph node tenderness. She denies nausea, vomiting or diarrhea.  Past Medical History  Diagnosis Date  . Shingles    History reviewed. No pertinent past surgical history. History reviewed. No pertinent family history. History  Substance Use Topics  . Smoking status: Never Smoker   . Smokeless tobacco: Not on file  . Alcohol Use: No   OB History    No data available     Review of Systems  All other systems reviewed and are negative.   Allergies  Shellfish allergy  Home Medications   Prior to Admission medications   Medication Sig Start Date End Date Taking? Authorizing Provider  ibuprofen (ADVIL,MOTRIN) 800 MG tablet Take 1 tablet (800 mg total) by mouth 3 (three) times daily. 11/08/11   Kaitlyn Szekalski, PA-C  ibuprofen (ADVIL,MOTRIN) 800 MG tablet Take 1 tablet (800 mg total) by mouth 3 (three) times daily. 04/05/14   Purvis SheffieldForrest Harrison, MD  lansoprazole (PREVACID) 30 MG capsule Take 1 capsule (30 mg total) by mouth daily at 12 noon. 12/10/13   Loren Raceravid Yelverton, MD   BP 175/84 mmHg  Pulse 87  Temp(Src) 98.8 F (37.1 C) (Oral)  Resp 16  Ht 5\' 6"  (1.676 m)  Wt 250 lb (113.399 kg)  BMI 40.37 kg/m2  SpO2 100%   Physical Exam General: Well-developed, well-nourished female in no acute distress; appearance consistent with age of record HENT: normocephalic; atraumatic; pharyngeal exudate; no trismus; no dysphonia; uvula  midline; no nasal congestion Eyes: pupils equal, round and reactive to light; extraocular muscles intact Neck: supple; mild anterior cervical lymphadenopathy Heart: regular rate and rhythm Lungs: clear to auscultation bilaterally Abdomen: soft; nondistended; nontender; bowel sounds present Extremities: No deformity; full range of motion Neurologic: Awake, alert and oriented; motor function intact in all extremities and symmetric; no facial droop Skin: Warm and dry Psychiatric: Normal mood and affect    ED Course  Procedures (including critical care time)   MDM     Paula LibraJohn Kyrah Schiro, MD 07/23/14 0151

## 2014-07-25 LAB — CULTURE, GROUP A STREP: Strep A Culture: NEGATIVE

## 2014-11-14 ENCOUNTER — Encounter (HOSPITAL_BASED_OUTPATIENT_CLINIC_OR_DEPARTMENT_OTHER): Payer: Self-pay | Admitting: Emergency Medicine

## 2014-11-14 ENCOUNTER — Emergency Department (HOSPITAL_BASED_OUTPATIENT_CLINIC_OR_DEPARTMENT_OTHER)
Admission: EM | Admit: 2014-11-14 | Discharge: 2014-11-14 | Disposition: A | Discharge status: Home / Self Care | Payer: No Typology Code available for payment source | Attending: Emergency Medicine | Admitting: Emergency Medicine

## 2014-11-14 DIAGNOSIS — G8929 Other chronic pain: Secondary | ICD-10-CM | POA: Diagnosis not present

## 2014-11-14 DIAGNOSIS — Z8619 Personal history of other infectious and parasitic diseases: Secondary | ICD-10-CM | POA: Insufficient documentation

## 2014-11-14 DIAGNOSIS — K0889 Other specified disorders of teeth and supporting structures: Secondary | ICD-10-CM | POA: Diagnosis not present

## 2014-11-14 DIAGNOSIS — Z79899 Other long term (current) drug therapy: Secondary | ICD-10-CM | POA: Diagnosis not present

## 2014-11-14 DIAGNOSIS — K0381 Cracked tooth: Secondary | ICD-10-CM | POA: Insufficient documentation

## 2014-11-14 MED ORDER — NAPROXEN 500 MG PO TABS: 500.0000 mg | ORAL_TABLET | Freq: Two times a day (BID) | ORAL | Status: DC

## 2014-11-14 NOTE — ED Notes (Signed)
Pt reports dental pain to left upper posterior tooth, x 1 month, was seen at dentist 2 weeks ago. Recommended root canal pt unable to afford, states otc medications no longer controlling pain

## 2014-11-14 NOTE — ED Provider Notes (Signed)
CSN: [id number]     Arrival date & time 11/14/14  1044 History  By signing my name below, I, Darlene Ayala, attest that this documentation has been prepared under the direction and in the presence of Darlene Octave, MD . Electronically Signed: Freida Ayala, Scribe. 11/14/2014. 11:15 AM.    Chief Complaint  Patient presents with  . Dental Pain   The history is provided by the patient. No language interpreter was used.   HPI Comments:  Darlene Ayala is a 24 y.o. female who presents to the Emergency Department complaining of moderate left upper dental pain for ~ 1 month due to chipped tooth. Pt notes her pain worsened last night. She has been taking advil with minimal relief. She was evaluated by a dentist ~ 2 weeks ago and was advised to have a root canal but states she wants the tooth extracted and has not been able to do so  due to lack of insurance.  Pt denies fever, vomiting, difficulty swallowing, and SOB.   Past Medical History  Diagnosis Date  . Shingles    History reviewed. No pertinent past surgical history. History reviewed. No pertinent family history. Social History  Substance Use Topics  . Smoking status: Never Smoker   . Smokeless tobacco: None  . Alcohol Use: No   OB History    No data available     Review of Systems  Constitutional: Negative for fever and chills.  HENT: Positive for dental problem. Negative for trouble swallowing.   Respiratory: Negative for shortness of breath.   Cardiovascular: Negative for chest pain.  Gastrointestinal: Negative for vomiting.    Allergies  Shellfish allergy  Home Medications   Prior to Admission medications   Medication Sig Start Date End Date Taking? Authorizing Provider  azithromycin (ZITHROMAX) 500 MG tablet Take 1 tablet (500 mg total) by mouth daily. 07/23/14   John Molpus, MD  fluconazole (DIFLUCAN) 150 MG tablet Take 1 tablet as needed for vaginal yeast infection. May repeat in 3 days if needed. 07/23/14   John  Molpus, MD  ibuprofen (ADVIL,MOTRIN) 800 MG tablet Take 1 tablet (800 mg total) by mouth 3 (three) times daily. 11/08/11   Kaitlyn Szekalski, PA-C  ibuprofen (ADVIL,MOTRIN) 800 MG tablet Take 1 tablet (800 mg total) by mouth 3 (three) times daily. 04/05/14   Purvis Sheffield, MD  lansoprazole (PREVACID) 30 MG capsule Take 1 capsule (30 mg total) by mouth daily at 12 noon. 12/10/13   Loren Racer, MD  naproxen (NAPROSYN) 500 MG tablet Take 1 tablet (500 mg total) by mouth 2 (two) times daily. 11/14/14   Darlene Octave, MD   BP 157/84 mmHg  Pulse 102  Temp(Src) 98.5 F (36.9 C) (Oral)  Resp 18  Ht  (1.676 m)  Wt 240 lb (108.863 kg)  BMI 38.76 kg/m2  SpO2 100%  LMP 11/12/2014 Physical Exam  Constitutional: She is oriented to person, place, and time. She appears well-developed and well-nourished. No distress.  HENT:  Head: Normocephalic and atraumatic.  Mouth/Throat: Oropharynx is clear and moist. No oropharyngeal exudate.  Left upper incisor is tender No abscess No trismus Floor of mouth is soft  Eyes: Conjunctivae and EOM are normal. Pupils are equal, round, and reactive to light.  Neck: Normal range of motion. Neck supple.  No meningismus.  Cardiovascular: Normal rate, regular rhythm, normal heart sounds and intact distal pulses.   No murmur heard. Pulmonary/Chest: Effort normal and breath sounds normal. No respiratory distress.  Abdominal: Soft. There is  no tenderness. There is no rebound and no guarding.  Musculoskeletal: Normal range of motion. She exhibits no edema or tenderness.  Neurological: She is alert and oriented to person, place, and time. No cranial nerve deficit. She exhibits normal muscle tone. Coordination normal.  No ataxia on finger to nose bilaterally. No pronator drift. 5/5 strength throughout. CN 2-12 intact.Equal grip strength. Sensation intact.   Skin: Skin is warm.  Psychiatric: She has a normal mood and affect. Her behavior is normal.  Nursing note  and vitals reviewed.   ED Course  Procedures   DIAGNOSTIC STUDIES:  Oxygen Saturation is 100% on RA, normal by my interpretation.    COORDINATION OF CARE:  11:07 AM Advised pt to follow up with dentist. Discussed treatment plan with pt at bedside and pt agreed to plan.    MDM   Final diagnoses:  Pain, dental   Chronic dental pain for the past one month. Was told she needs a root canal. No fever or vomiting. No difficulty breathing or swallowing.  No drainable abscess. No evidence of Ludwig angina.  Patient needs to follow up with her dentist.  I personally performed the services described in this documentation, which was scribed in my presence. The recorded information has been reviewed and is accurate.    Darlene OctaveStephen Tonnya Garbett, MD 11/14/14 [phone]

## 2014-11-14 NOTE — Discharge Instructions (Signed)

## 2014-12-28 ENCOUNTER — Encounter (HOSPITAL_BASED_OUTPATIENT_CLINIC_OR_DEPARTMENT_OTHER): Payer: Self-pay | Admitting: *Deleted

## 2014-12-28 DIAGNOSIS — Z792 Long term (current) use of antibiotics: Secondary | ICD-10-CM | POA: Insufficient documentation

## 2014-12-28 DIAGNOSIS — I10 Essential (primary) hypertension: Secondary | ICD-10-CM | POA: Diagnosis not present

## 2014-12-28 DIAGNOSIS — Z8619 Personal history of other infectious and parasitic diseases: Secondary | ICD-10-CM | POA: Insufficient documentation

## 2014-12-28 DIAGNOSIS — M545 Low back pain: Secondary | ICD-10-CM | POA: Insufficient documentation

## 2014-12-28 DIAGNOSIS — Z79899 Other long term (current) drug therapy: Secondary | ICD-10-CM | POA: Insufficient documentation

## 2014-12-28 DIAGNOSIS — R8299 Other abnormal findings in urine: Secondary | ICD-10-CM | POA: Diagnosis not present

## 2014-12-28 DIAGNOSIS — Z3202 Encounter for pregnancy test, result negative: Secondary | ICD-10-CM | POA: Insufficient documentation

## 2014-12-28 LAB — URINALYSIS, ROUTINE W REFLEX MICROSCOPIC
Bilirubin Urine: NEGATIVE
Glucose, UA: NEGATIVE mg/dL
Hgb urine dipstick: NEGATIVE
KETONES UR: NEGATIVE mg/dL
NITRITE: NEGATIVE
PH: 6.5 (ref 5.0–8.0)
Protein, ur: NEGATIVE mg/dL
Specific Gravity, Urine: 1.025 (ref 1.005–1.030)

## 2014-12-28 LAB — PREGNANCY, URINE: Preg Test, Ur: NEGATIVE

## 2014-12-28 LAB — URINE MICROSCOPIC-ADD ON

## 2014-12-28 NOTE — ED Notes (Signed)
Lower right back pain and dark urine. Denies fever. Wants to make sure she doesn't have UTI

## 2014-12-29 ENCOUNTER — Encounter (HOSPITAL_BASED_OUTPATIENT_CLINIC_OR_DEPARTMENT_OTHER): Payer: Self-pay | Admitting: Emergency Medicine

## 2014-12-29 ENCOUNTER — Emergency Department (HOSPITAL_BASED_OUTPATIENT_CLINIC_OR_DEPARTMENT_OTHER)
Admission: EM | Admit: 2014-12-29 | Discharge: 2014-12-29 | Disposition: A | Discharge status: Home / Self Care | Payer: No Typology Code available for payment source | Attending: Emergency Medicine | Admitting: Emergency Medicine

## 2014-12-29 DIAGNOSIS — I10 Essential (primary) hypertension: Secondary | ICD-10-CM

## 2014-12-29 DIAGNOSIS — M545 Low back pain, unspecified: Secondary | ICD-10-CM

## 2014-12-29 NOTE — Discharge Instructions (Signed)
Back Exercises The following exercises strengthen the muscles that help to support the back. They also help to keep the lower back flexible. Doing these exercises can help to prevent back pain or lessen existing pain. If you have back pain or discomfort, try doing these exercises 2-3 times each day or as told by your health care provider. When the pain goes away, do them once each day, but increase the number of times that you repeat the steps for each exercise (do more repetitions). If you do not have back pain or discomfort, do these exercises once each day or as told by your health care provider. EXERCISES Single Knee to Chest Repeat these steps 3-5 times for each leg:  Lie on your back on a firm bed or the floor with your legs extended.  Bring one knee to your chest. Your other leg should stay extended and in contact with the floor.  Hold your knee in place by grabbing your knee or thigh.  Pull on your knee until you feel a gentle stretch in your lower back.  Hold the stretch for 10-30 seconds.  Slowly release and straighten your leg. Pelvic Tilt Repeat these steps 5-10 times:  Lie on your back on a firm bed or the floor with your legs extended.  Bend your knees so they are pointing toward the ceiling and your feet are flat on the floor.  Tighten your lower abdominal muscles to press your lower back against the floor. This motion will tilt your pelvis so your tailbone points up toward the ceiling instead of pointing to your feet or the floor.  With gentle tension and even breathing, hold this position for 5-10 seconds. Cat-Cow Repeat these steps until your lower back becomes more flexible:  Get into a hands-and-knees position on a firm surface. Keep your hands under your shoulders, and keep your knees under your hips. You may place padding under your knees for comfort.  Let your head hang down, and point your tailbone toward the floor so your lower back becomes rounded like the  back of a cat.  Hold this position for 5 seconds.  Slowly lift your head and point your tailbone up toward the ceiling so your back forms a sagging arch like the back of a cow.  Hold this position for 5 seconds. Press-Ups Repeat these steps 5-10 times:  Lie on your abdomen (face-down) on the floor.  Place your palms near your head, about shoulder-width apart.  While you keep your back as relaxed as possible and keep your hips on the floor, slowly straighten your arms to raise the top half of your body and lift your shoulders. Do not use your back muscles to raise your upper torso. You may adjust the placement of your hands to make yourself more comfortable.  Hold this position for 5 seconds while you keep your back relaxed.  Slowly return to lying flat on the floor. Bridges Repeat these steps 10 times:  Lie on your back on a firm surface.  Bend your knees so they are pointing toward the ceiling and your feet are flat on the floor.  Tighten your buttocks muscles and lift your buttocks off of the floor until your waist is at almost the same height as your knees. You should feel the muscles working in your buttocks and the back of your thighs. If you do not feel these muscles, slide your feet 1-2 inches farther away from your buttocks.  Hold this position for 3-5  seconds.  Slowly lower your hips to the starting position, and allow your buttocks muscles to relax completely. If this exercise is too easy, try doing it with your arms crossed over your chest. Abdominal Crunches Repeat these steps 5-10 times:  Lie on your back on a firm bed or the floor with your legs extended.  Bend your knees so they are pointing toward the ceiling and your feet are flat on the floor.  Cross your arms over your chest.  Tip your chin slightly toward your chest without bending your neck.  Tighten your abdominal muscles and slowly raise your trunk (torso) high enough to lift your shoulder blades a  tiny bit off of the floor. Avoid raising your torso higher than that, because it can put too much stress on your low back and it does not help to strengthen your abdominal muscles.  Slowly return to your starting position. Back Lifts Repeat these steps 5-10 times:  Lie on your abdomen (face-down) with your arms at your sides, and rest your forehead on the floor.  Tighten the muscles in your legs and your buttocks.  Slowly lift your chest off of the floor while you keep your hips pressed to the floor. Keep the back of your head in line with the curve in your back. Your eyes should be looking at the floor.  Hold this position for 3-5 seconds.  Slowly return to your starting position. SEEK MEDICAL CARE IF:  Your back pain or discomfort gets much worse when you do an exercise.  Your back pain or discomfort does not lessen within 2 hours after you exercise. If you have any of these problems, stop doing these exercises right away. Do not do them again unless your health care provider says that you can. SEEK IMMEDIATE MEDICAL CARE IF:  You develop sudden, severe back pain. If this happens, stop doing the exercises right away. Do not do them again unless your health care provider says that you can.   This information is not intended to replace advice given to you by your health care provider. Make sure you discuss any questions you have with your health care provider.   Document Released: 02/03/2004 Document Revised: 09/16/2014 Document Reviewed: 02/19/2014 Elsevier Interactive Patient Education 2016 [place] Injury Prevention Back injuries can be very painful. They can also be difficult to heal. After having one back injury, you are more likely to injure your back again. It is important to learn how to avoid injuring or re-injuring your back. The following tips can help you to prevent a back injury. WHAT SHOULD I KNOW ABOUT PHYSICAL FITNESS?  Exercise for 30 minutes per day on  most days of the week or as directed by your health care provider. Make sure to:  Do aerobic exercises, such as walking, jogging, biking, or swimming.  Do exercises that increase balance and strength, such as tai chi and yoga. These can decrease your risk of falling and injuring your back.  Do stretching exercises to help with flexibility.  Try to develop strong abdominal muscles. Your abdominal muscles provide a lot of the support that is needed by your back.  Maintain a healthy weight. This helps to decrease your risk of a back injury. WHAT SHOULD I KNOW ABOUT MY DIET?  Talk with your health care provider about your overall diet. Take supplements and vitamins only as directed by your health care provider.  Talk with your health care provider about how much calcium and  vitamin D you need each day. These nutrients help to prevent weakening of the bones (osteoporosis). Osteoporosis can cause broken (fractured) bones, which lead to back pain. °· Include good sources of calcium in your diet, such as dairy products, green leafy vegetables, and products that have had calcium added to them (fortified). °· Include good sources of vitamin D in your diet, such as milk and foods that are fortified with vitamin D. °WHAT SHOULD I KNOW ABOUT MY POSTURE? °· Sit up straight and stand up straight. Avoid leaning forward when you sit or hunching over when you stand. °· Choose chairs that have good low-back (lumbar) support. °· If you work at a desk, sit close to it so you do not need to lean over. Keep your chin tucked in. Keep your neck drawn back, and keep your elbows bent at a right angle. Your arms should look like the letter "L." °· Sit high and close to the steering wheel when you drive. Add a lumbar support to your car seat, if needed. °· Avoid sitting or standing in one position for very long. Take breaks to get up, stretch, and walk around at least one time every hour. Take breaks every hour if you are  driving for long periods of time. °· Sleep on your side with your knees slightly bent, or sleep on your back with a pillow under your knees. Do not lie on the front of your body to sleep. °WHAT SHOULD I KNOW ABOUT LIFTING, TWISTING, AND REACHING? °Lifting and Heavy Lifting °· Avoid heavy lifting, especially repetitive heavy lifting. If you must do heavy lifting: °· Stretch before lifting. °· Work slowly. °· Rest between lifts. °· Use a tool such as a cart or a dolly to move objects if one is available. °· Make several small trips instead of carrying one heavy load. °· Ask for help when you need it, especially when moving big objects. °· Follow these steps when lifting: °· Stand with your feet shoulder-width apart. °· Get as close to the object as you can. Do not try to pick up a heavy object that is far from your body. °· Use handles or lifting straps if they are available. °· Bend at your knees. Squat down, but keep your heels off the floor. °· Keep your shoulders pulled back, your chin tucked in, and your back straight. °· Lift the object slowly while you tighten the muscles in your legs, abdomen, and buttocks. Keep the object as close to the center of your body as possible. °· Follow these steps when putting down a heavy load: °· Stand with your feet shoulder-width apart. °· Lower the object slowly while you tighten the muscles in your legs, abdomen, and buttocks. Keep the object as close to the center of your body as possible. °· Keep your shoulders pulled back, your chin tucked in, and your back straight. °· Bend at your knees. Squat down, but keep your heels off the floor. °· Use handles or lifting straps if they are available. °Twisting and Reaching °· Avoid lifting heavy objects above your waist. °· Do not twist at your waist while you are lifting or carrying a load. If you need to turn, move your feet. °· Do not bend over without bending at your knees. °· Avoid reaching over your head, across a table, or  for an object on a high surface. °WHAT ARE SOME OTHER TIPS? °· Avoid wet floors and icy ground. Keep sidewalks clear of ice to prevent   falls.  Do not sleep on a mattress that is too soft or too hard.  Keep items that are used frequently within easy reach.  Put heavier objects on shelves at waist level, and put lighter objects on lower or higher shelves.  Find ways to decrease your stress, such as exercise, massage, or relaxation techniques. Stress can build up in your muscles. Tense muscles are more vulnerable to injury.  Talk with your health care provider if you feel anxious or depressed. These conditions can make back pain worse.  Wear flat heel shoes with cushioned soles.  Avoid sudden movements.  Use both shoulder straps when carrying a backpack.  Do not use any tobacco products, including cigarettes, chewing tobacco, or electronic cigarettes. If you need help quitting, ask your health care provider.   This information is not intended to replace advice given to you by your health care provider. Make sure you discuss any questions you have with your health care provider.   Document Released: 02/03/2004 Document Revised: 05/12/2014 Document Reviewed: 12/30/2013 Elsevier Interactive Patient Education 2016 [employer]  Back Pain, Adult Back pain is very common in adults.The cause of back pain is rarely dangerous and the pain often gets better over time.The cause of your back pain may not be known. Some common causes of back pain include:  Strain of the muscles or ligaments supporting the spine.  Wear and tear (degeneration) of the spinal disks.  Arthritis.  Direct injury to the back. For many people, back pain may return. Since back pain is rarely dangerous, most people can learn to manage this condition on their own. HOME CARE INSTRUCTIONS Watch your back pain for any changes. The following actions may help to lessen any discomfort you are feeling:  Remain active. It is  stressful on your back to sit or stand in one place for long periods of time. Do not sit, drive, or stand in one place for more than 30 minutes at a time. Take short walks on even surfaces as soon as you are able.Try to increase the length of time you walk each day.  Exercise regularly as directed by your health care provider. Exercise helps your back heal faster. It also helps avoid future injury by keeping your muscles strong and flexible.  Do not stay in bed.Resting more than 1-2 days can delay your recovery.  Pay attention to your body when you bend and lift. The most comfortable positions are those that put less stress on your recovering back. Always use proper lifting techniques, including:  Bending your knees.  Keeping the load close to your body.  Avoiding twisting.  Find a comfortable position to sleep. Use a firm mattress and lie on your side with your knees slightly bent. If you lie on your back, put a pillow under your knees.  Avoid feeling anxious or stressed.Stress increases muscle tension and can worsen back pain.It is important to recognize when you are anxious or stressed and learn ways to manage it, such as with exercise.  Take medicines only as directed by your health care provider. Over-the-counter medicines to reduce pain and inflammation are often the most helpful.Your health care provider may prescribe muscle relaxant drugs.These medicines help dull your pain so you can more quickly return to your normal activities and healthy exercise.  Apply ice to the injured area:  Put ice in a plastic bag.  Place a towel between your skin and the bag.  Leave the ice on for 20 minutes, 2-3 times  a day for the first 2-3 days. After that, ice and heat may be alternated to reduce pain and spasms.  Maintain a healthy weight. Excess weight puts extra stress on your back and makes it difficult to maintain good posture. SEEK MEDICAL CARE IF:  You have pain that is not relieved  with rest or medicine.  You have increasing pain going down into the legs or buttocks.  You have pain that does not improve in one week.  You have night pain.  You lose weight.  You have a fever or chills. SEEK IMMEDIATE MEDICAL CARE IF:   You develop new bowel or bladder control problems.  You have unusual weakness or numbness in your arms or legs.  You develop nausea or vomiting.  You develop abdominal pain.  You feel faint.   This information is not intended to replace advice given to you by your health care provider. Make sure you discuss any questions you have with your health care provider.   Document Released: 12/26/2004 Document Revised: 01/16/2014 Document Reviewed: 04/29/2013 Elsevier Interactive Patient Education 2016 [place] DASH stands for "Dietary Approaches to Stop Hypertension." The DASH eating plan is a healthy eating plan that has been shown to reduce high blood pressure (hypertension). Additional health benefits may include reducing the risk of type 2 diabetes mellitus, heart disease, and stroke. The DASH eating plan may also help with weight loss. WHAT DO I NEED TO KNOW ABOUT THE DASH EATING PLAN? For the DASH eating plan, you will follow these general guidelines:  Choose foods with a percent daily value for sodium of less than 5% (as listed on the food label).  Use salt-free seasonings or herbs instead of table salt or sea salt.  Check with your health care provider or pharmacist before using salt substitutes.  Eat lower-sodium products, often labeled as "lower sodium" or "no salt added."  Eat fresh foods.  Eat more vegetables, fruits, and low-fat dairy products.  Choose whole grains. Look for the word "whole" as the first word in the ingredient list.  Choose fish and skinless chicken or [place] more often than red meat. Limit fish, poultry, and meat to 6 oz (170 g) each day.  Limit sweets, desserts, sugars, and sugary  drinks.  Choose heart-healthy fats.  Limit cheese to 1 oz (28 g) per day.  Eat more home-cooked food and less restaurant, buffet, and fast food.  Limit fried foods.  Cook foods using methods other than frying.  Limit canned vegetables. If you do use them, rinse them well to decrease the sodium.  When eating at a restaurant, ask that your food be prepared with less salt, or no salt if possible. WHAT FOODS CAN I EAT? Seek help from a dietitian for individual calorie needs. Grains Whole grain or whole wheat bread. Brown rice. Whole grain or whole wheat pasta. Quinoa, bulgur, and whole grain cereals. Low-sodium cereals. Corn or whole wheat flour tortillas. Whole grain cornbread. Whole grain crackers. Low-sodium crackers. Vegetables Fresh or frozen vegetables (raw, steamed, roasted, or grilled). Low-sodium or reduced-sodium tomato and vegetable juices. Low-sodium or reduced-sodium tomato sauce and paste. Low-sodium or reduced-sodium canned vegetables.  Fruits All fresh, canned (in natural juice), or frozen fruits. Meat and Other Protein Products Ground beef (85% or leaner), grass-fed beef, or beef trimmed of fat. Skinless chicken or [place]. Ground chicken or [place]. Pork trimmed of fat. All fish and seafood. Eggs. Dried beans, peas, or lentils. Unsalted nuts and seeds. Unsalted  canned beans. Dairy Low-fat dairy products, such as skim or 1% milk, 2% or reduced-fat cheeses, low-fat ricotta or cottage cheese, or plain low-fat yogurt. Low-sodium or reduced-sodium cheeses. Fats and Oils Tub margarines without trans fats. Light or reduced-fat mayonnaise and salad dressings (reduced sodium). Avocado. Safflower, olive, or canola oils. Natural peanut or almond butter. Other Unsalted popcorn and pretzels. The items listed above may not be a complete list of recommended foods or beverages. Contact your dietitian for more options. WHAT FOODS ARE NOT RECOMMENDED? Grains White bread. White pasta.  White rice. Refined cornbread. Bagels and croissants. Crackers that contain trans fat. Vegetables Creamed or fried vegetables. Vegetables in a cheese sauce. Regular canned vegetables. Regular canned tomato sauce and paste. Regular tomato and vegetable juices. Fruits Dried fruits. Canned fruit in light or heavy syrup. Fruit juice. Meat and Other Protein Products Fatty cuts of meat. Ribs, chicken wings, bacon, sausage, bologna, salami, chitterlings, fatback, hot dogs, bratwurst, and packaged luncheon meats. Salted nuts and seeds. Canned beans with salt. Dairy Whole or 2% milk, cream, half-and-half, and cream cheese. Whole-fat or sweetened yogurt. Full-fat cheeses or blue cheese. Nondairy creamers and whipped toppings. Processed cheese, cheese spreads, or cheese curds. Condiments Onion and garlic salt, seasoned salt, table salt, and sea salt. Canned and packaged gravies. Worcestershire sauce. Tartar sauce. Barbecue sauce. Teriyaki sauce. Soy sauce, including reduced sodium. Steak sauce. Fish sauce. Oyster sauce. Cocktail sauce. Horseradish. Ketchup and mustard. Meat flavorings and tenderizers. Bouillon cubes. Hot sauce. Tabasco sauce. Marinades. Taco seasonings. Relishes. Fats and Oils Butter, stick margarine, lard, shortening, ghee, and bacon fat. Coconut, palm kernel, or palm oils. Regular salad dressings. Other Pickles and olives. Salted popcorn and pretzels. The items listed above may not be a complete list of foods and beverages to avoid. Contact your dietitian for more information. WHERE CAN I FIND MORE INFORMATION? National Heart, Lung, and Blood Institute: travelstabloid.com   This information is not intended to replace advice given to you by your health care provider. Make sure you discuss any questions you have with your health care provider.   Document Released: 12/15/2010 Document Revised: 01/16/2014 Document Reviewed: 10/30/2012 Elsevier Interactive  Patient Education 2016 [employer].  Hypertension Hypertension, commonly called high blood pressure, is when the force of blood pumping through your arteries is too strong. Your arteries are the blood vessels that carry blood from your heart throughout your body. A blood pressure reading consists of a higher number over a lower number, such as 110/72. The higher number (systolic) is the pressure inside your arteries when your heart pumps. The lower number (diastolic) is the pressure inside your arteries when your heart relaxes. Ideally you want your blood pressure below 120/80. Hypertension forces your heart to work harder to pump blood. Your arteries may become narrow or stiff. Having untreated or uncontrolled hypertension can cause heart attack, stroke, kidney disease, and other problems. RISK FACTORS Some risk factors for high blood pressure are controllable. Others are not.  Risk factors you cannot control include:   Race. You may be at higher risk if you are African American.  Age. Risk increases with age.  Gender. Men are at higher risk than women before age 82 years. After age 84, women are at higher risk than men. Risk factors you can control include:  Not getting enough exercise or physical activity.  Being overweight.  Getting too much fat, sugar, calories, or salt in your diet.  Drinking too much alcohol. SIGNS AND SYMPTOMS Hypertension does not usually  cause signs or symptoms. Extremely high blood pressure (hypertensive crisis) may cause headache, anxiety, shortness of breath, and nosebleed. DIAGNOSIS To check if you have hypertension, your health care provider will measure your blood pressure while you are seated, with your arm held at the level of your heart. It should be measured at least twice using the same arm. Certain conditions can cause a difference in blood pressure between your right and left arms. A blood pressure reading that is higher than normal on one occasion  does not mean that you need treatment. If it is not clear whether you have high blood pressure, you may be asked to return on a different day to have your blood pressure checked again. Or, you may be asked to monitor your blood pressure at home for 1 or more weeks. TREATMENT Treating high blood pressure includes making lifestyle changes and possibly taking medicine. Living a healthy lifestyle can help lower high blood pressure. You may need to change some of your habits. Lifestyle changes may include:  Following the DASH diet. This diet is high in fruits, vegetables, and whole grains. It is low in salt, red meat, and added sugars.  Keep your sodium intake below 2,300 mg per day.  Getting at least 30-45 minutes of aerobic exercise at least 4 times per week.  Losing weight if necessary.  Not smoking.  Limiting alcoholic beverages.  Learning ways to reduce stress. Your health care provider may prescribe medicine if lifestyle changes are not enough to get your blood pressure under control, and if one of the following is true:  You are 35-59 years of age and your systolic blood pressure is above 140.  You are 13 years of age or older, and your systolic blood pressure is above 150.  Your diastolic blood pressure is above 90.  You have diabetes, and your systolic blood pressure is over 885 or your diastolic blood pressure is over 90.  You have kidney disease and your blood pressure is above 140/90.  You have heart disease and your blood pressure is above 140/90. Your personal target blood pressure may vary depending on your medical conditions, your age, and other factors. HOME CARE INSTRUCTIONS  Have your blood pressure rechecked as directed by your health care provider.   Take medicines only as directed by your health care provider. Follow the directions carefully. Blood pressure medicines must be taken as prescribed. The medicine does not work as well when you skip doses. Skipping  doses also puts you at risk for problems.  Do not smoke.   Monitor your blood pressure at home as directed by your health care provider. SEEK MEDICAL CARE IF:   You think you are having a reaction to medicines taken.  You have recurrent headaches or feel dizzy.  You have swelling in your ankles.  You have trouble with your vision. SEEK IMMEDIATE MEDICAL CARE IF:  You develop a severe headache or confusion.  You have unusual weakness, numbness, or feel faint.  You have severe chest or abdominal pain.  You vomit repeatedly.  You have trouble breathing. MAKE SURE YOU:   Understand these instructions.  Will watch your condition.  Will get help right away if you are not doing well or get worse.   This information is not intended to replace advice given to you by your health care provider. Make sure you discuss any questions you have with your health care provider.   Document Released: 12/26/2004 Document Revised: 05/12/2014 Document Reviewed: 10/18/2012  Chartered certified accountant Patient Education [employer].

## 2014-12-29 NOTE — ED Notes (Addendum)
See Providers notes for assessment.  Discharged prior to nurse being able to assess.

## 2014-12-29 NOTE — ED Provider Notes (Signed)
CSN: [id number]     Arrival date & time 12/28/14  2324 History   First MD Initiated Contact with Patient 12/29/14 0017     Chief Complaint  Patient presents with  . Urinary Tract Infection     (Consider location/radiation/quality/duration/timing/severity/associated sxs/prior Treatment) HPI  Darlene Ayala Is a 24 year old female who presents emergency Department with chief complaint of right lower back pain. Patient states that she has had about 2 days of right lower back pain which is worse with movement, better with rest. She describes the pain as moderate, nonradiating. Better with Tylenol. Patient is also noted dark color of her urine and was concerned for UTI. She admits to drinking less water than normal lately. She denies fevers, chills, nausea, flank pain, urgency, frequency, hematuria or foul odor of urine. Past Medical History  Diagnosis Date  . Shingles    History reviewed. No pertinent past surgical history. No family history on file. Social History  Substance Use Topics  . Smoking status: Never Smoker   . Smokeless tobacco: None  . Alcohol Use: No   OB History    No data available     Review of Systems  Ten systems reviewed and are negative for acute change, except as noted in the HPI.    Allergies  Shellfish allergy  Home Medications   Prior to Admission medications   Medication Sig Start Date End Date Taking? Authorizing Provider  azithromycin (ZITHROMAX) 500 MG tablet Take 1 tablet (500 mg total) by mouth daily. 07/23/14   John Molpus, MD  fluconazole (DIFLUCAN) 150 MG tablet Take 1 tablet as needed for vaginal yeast infection. May repeat in 3 days if needed. 07/23/14   John Molpus, MD  ibuprofen (ADVIL,MOTRIN) 800 MG tablet Take 1 tablet (800 mg total) by mouth 3 (three) times daily. 11/08/11   Kaitlyn Szekalski, PA-C  ibuprofen (ADVIL,MOTRIN) 800 MG tablet Take 1 tablet (800 mg total) by mouth 3 (three) times daily. 04/05/14   Purvis SheffieldForrest Harrison, MD   lansoprazole (PREVACID) 30 MG capsule Take 1 capsule (30 mg total) by mouth daily at 12 noon. 12/10/13   Loren Raceravid Yelverton, MD  naproxen (NAPROSYN) 500 MG tablet Take 1 tablet (500 mg total) by mouth 2 (two) times daily. 11/14/14   Glynn OctaveStephen Rancour, MD   BP 145/91 mmHg  Pulse 86  Temp(Src) 98.4 F (36.9 C) (Oral)  Resp 16  Ht 5\' 6"  (1.676 m)  Wt 106.595 kg  BMI 37.95 kg/m2  SpO2 100%  LMP 12/25/2014 Physical Exam Physical Exam  Nursing note and vitals reviewed. Constitutional: She is oriented to person, place, and time. She appears well-developed and well-nourished. No distress.  HENT:  Head: Normocephalic and atraumatic.  Eyes: Conjunctivae normal and EOM are normal. Pupils are equal, round, and reactive to light. No scleral icterus.  Neck: Normal range of motion.  Cardiovascular: Normal rate, regular rhythm and normal heart sounds.  Exam reveals no gallop and no friction rub.   No murmur heard. Pulmonary/Chest: Effort normal and breath sounds normal. No respiratory distress.  Abdominal: Soft. Bowel sounds are normal. She exhibits no distension and no mass. There is no tenderness. There is no guarding.  no CVA tenderness or suprapubic pain  Musculoskeletal: Tender to palpation in the right erector spinae day and to our muscles. Full range of motion of the back, full strength in the lower extremities, normal DTRs. Pain is worse with right lateral flexion and right twisting  Neurological: She is alert and oriented to person, place, and  time.  Skin: Skin is warm and dry. She is not diaphoretic.    ED Course  Procedures (including critical care time) Labs Review Labs Reviewed  URINALYSIS, ROUTINE W REFLEX MICROSCOPIC (NOT AT [facility]) - Abnormal; Notable for the following:    Leukocytes, UA SMALL (*)    All other components within normal limits  URINE MICROSCOPIC-ADD ON - Abnormal; Notable for the following:    Squamous Epithelial / LPF 0-5 (*)    Bacteria, UA FEW (*)    All other  components within normal limits  PREGNANCY, URINE    Imaging Review No results found. I have personally reviewed and evaluated these images and lab results as part of my medical decision-making.   EKG Interpretation None      MDM   Final diagnoses:  Right-sided low back pain without sciatica  Essential hypertension   no sign of urinary tract infection, likely musculoskeletal spasm Patient with back pain.  No neurological deficits and normal neuro exam.  Patient can walk but states is painful.  No loss of bowel or bladder control.  No concern for cauda equina.  No fever, night sweats, weight loss, h/o cancer, IVDU.  RICE protocol and pain medicine indicated and discussed with patient.      Arthor Captain, PA-C 12/31/14 1712  Laurence Spates, MD 01/01/15 [phone]

## 2015-02-01 ENCOUNTER — Emergency Department (HOSPITAL_BASED_OUTPATIENT_CLINIC_OR_DEPARTMENT_OTHER)
Admission: EM | Admit: 2015-02-01 | Discharge: 2015-02-01 | Disposition: A | Discharge status: Home / Self Care | Payer: No Typology Code available for payment source | Attending: Emergency Medicine | Admitting: Emergency Medicine

## 2015-02-01 ENCOUNTER — Emergency Department (HOSPITAL_BASED_OUTPATIENT_CLINIC_OR_DEPARTMENT_OTHER): Payer: No Typology Code available for payment source

## 2015-02-01 ENCOUNTER — Encounter (HOSPITAL_BASED_OUTPATIENT_CLINIC_OR_DEPARTMENT_OTHER): Payer: Self-pay | Admitting: Emergency Medicine

## 2015-02-01 DIAGNOSIS — R8299 Other abnormal findings in urine: Secondary | ICD-10-CM | POA: Insufficient documentation

## 2015-02-01 DIAGNOSIS — R109 Unspecified abdominal pain: Secondary | ICD-10-CM | POA: Insufficient documentation

## 2015-02-01 DIAGNOSIS — Z3202 Encounter for pregnancy test, result negative: Secondary | ICD-10-CM | POA: Insufficient documentation

## 2015-02-01 DIAGNOSIS — R3911 Hesitancy of micturition: Secondary | ICD-10-CM | POA: Insufficient documentation

## 2015-02-01 DIAGNOSIS — M545 Low back pain, unspecified: Secondary | ICD-10-CM

## 2015-02-01 DIAGNOSIS — Z8619 Personal history of other infectious and parasitic diseases: Secondary | ICD-10-CM | POA: Insufficient documentation

## 2015-02-01 LAB — COMPREHENSIVE METABOLIC PANEL
ALT: 18 U/L (ref 14–54)
AST: 19 U/L (ref 15–41)
Albumin: 4.2 g/dL (ref 3.5–5.0)
Alkaline Phosphatase: 58 U/L (ref 38–126)
Anion gap: 5 (ref 5–15)
BUN: 10 mg/dL (ref 6–20)
CALCIUM: 8.8 mg/dL — AB (ref 8.9–10.3)
CHLORIDE: 105 mmol/L (ref 101–111)
CO2: 28 mmol/L (ref 22–32)
Creatinine, Ser: 0.91 mg/dL (ref 0.44–1.00)
Glucose, Bld: 120 mg/dL — ABNORMAL HIGH (ref 65–99)
Potassium: 3.9 mmol/L (ref 3.5–5.1)
Sodium: 138 mmol/L (ref 135–145)
TOTAL PROTEIN: 7.5 g/dL (ref 6.5–8.1)
Total Bilirubin: 0.4 mg/dL (ref 0.3–1.2)

## 2015-02-01 LAB — CBC WITH DIFFERENTIAL/PLATELET
BASOS ABS: 0 10*3/uL (ref 0.0–0.1)
Basophils Relative: 0 %
EOS ABS: 0.1 10*3/uL (ref 0.0–0.7)
EOS PCT: 1 %
HCT: 41.8 % (ref 36.0–46.0)
Hemoglobin: 13.7 g/dL (ref 12.0–15.0)
LYMPHS ABS: 1.6 10*3/uL (ref 0.7–4.0)
Lymphocytes Relative: 36 %
MCH: 28.7 pg (ref 26.0–34.0)
MCHC: 32.8 g/dL (ref 30.0–36.0)
MCV: 87.4 fL (ref 78.0–100.0)
Monocytes Absolute: 0.2 10*3/uL (ref 0.1–1.0)
Monocytes Relative: 5 %
NEUTROS PCT: 58 %
Neutro Abs: 2.6 10*3/uL (ref 1.7–7.7)
PLATELETS: 331 10*3/uL (ref 150–400)
RBC: 4.78 MIL/uL (ref 3.87–5.11)
RDW: 13.3 % (ref 11.5–15.5)
WBC: 4.5 10*3/uL (ref 4.0–10.5)

## 2015-02-01 LAB — URINALYSIS, ROUTINE W REFLEX MICROSCOPIC
BILIRUBIN URINE: NEGATIVE
Glucose, UA: NEGATIVE mg/dL
Hgb urine dipstick: NEGATIVE
Ketones, ur: NEGATIVE mg/dL
Leukocytes, UA: NEGATIVE
Nitrite: NEGATIVE
Protein, ur: NEGATIVE mg/dL
SPECIFIC GRAVITY, URINE: 1.015 (ref 1.005–1.030)
pH: 6 (ref 5.0–8.0)

## 2015-02-01 LAB — PREGNANCY, URINE: PREG TEST UR: NEGATIVE

## 2015-02-01 NOTE — ED Notes (Signed)
Pt having right flank pain radiating to right groin for 2-3 weeks.  Pt continues since last visit.  No fever.  Some frequency.  Some difficulty initiating stream.

## 2015-02-01 NOTE — ED Provider Notes (Signed)
CSN: [id number]     Arrival date & time 02/01/15  4098 History   First MD Initiated Contact with Patient 02/01/15 1014     Chief Complaint  Patient presents with  . Flank Pain     (Consider location/radiation/quality/duration/timing/severity/associated sxs/prior Treatment) HPI Darlene Ayala is a 25 y.o. female because in for evaluation of right lower back pain. Patient reports symptoms have been ongoing over the past 2 months. She reports being seen for this problem in December and diagnosed with musculoskeletal pain. She does report discomfort has been constant now for the past 2-3 weeks and does radiate into her right groin. She does report "heavy urine" and some urinary hesitancy, but no overt dysuria, hematuria. She denies any other fevers, chills, nausea or vomiting, abdominal pain, diarrhea or constipation, pelvic pain, vaginal discharge or bleeding. She has tried icy hot with some relief of her symptoms. Last menstrual period January 8 and was normal for her.  Past Medical History  Diagnosis Date  . Shingles    No past surgical history on file. No family history on file. Social History  Substance Use Topics  . Smoking status: Never Smoker   . Smokeless tobacco: None  . Alcohol Use: No   OB History    No data available     Review of Systems A 10 point review of systems was completed and was negative except for pertinent positives and negatives as mentioned in the history of present illness     Allergies  Shellfish allergy  Home Medications   Prior to Admission medications   Not on File   BP 141/72 mmHg  Pulse 77  Temp(Src) 98.6 F (37 C) (Oral)  Resp 18  Ht  (1.676 m)  Wt 106.595 kg  BMI 37.95 kg/m2  SpO2 100%  LMP 01/17/2015 Physical Exam  Constitutional: She is oriented to person, place, and time. She appears well-developed and well-nourished.  Overall well-appearing African American female.  HENT:  Head: Normocephalic and atraumatic.   Mouth/Throat: Oropharynx is clear and moist.  Eyes: Conjunctivae are normal. Pupils are equal, round, and reactive to light. Right eye exhibits no discharge. Left eye exhibits no discharge. No scleral icterus.  Neck: Neck supple.  Cardiovascular: Normal rate, regular rhythm and normal heart sounds.   Pulmonary/Chest: Effort normal and breath sounds normal. No respiratory distress. She has no wheezes. She has no rales.  Abdominal: Soft. There is no tenderness.  Musculoskeletal:  Mild tenderness over right SI joint with no other focal bony tenderness. No rash. No CVA tenderness. Full active range of motion.  Neurological: She is alert and oriented to person, place, and time.  Cranial Nerves II-XII grossly intact  Skin: Skin is warm and dry. No rash noted.  Psychiatric: She has a normal mood and affect.  Nursing note and vitals reviewed.   ED Course  Procedures (including critical care time) Labs Review Labs Reviewed  COMPREHENSIVE METABOLIC PANEL - Abnormal; Notable for the following:    Glucose, Bld 120 (*)    Calcium 8.8 (*)    All other components within normal limits  URINALYSIS, ROUTINE W REFLEX MICROSCOPIC (NOT AT [facility])  PREGNANCY, URINE  CBC WITH DIFFERENTIAL/PLATELET    Imaging Review US Abdomen Complete  02/01/2015  CLINICAL DATA:  Right flank pain radiating into right inguinal region for 3 weeks EXAM: ABDOMEN ULTRASOUND COMPLETE COMPARISON:  None. FINDINGS: Gallbladder: No gallstones or wall thickening visualized. There is no pericholecystic fluid. No sonographic Murphy sign noted by sonographer. Common  bile duct: Diameter: 2 mm. No intrahepatic, common hepatic, or common bile duct dilatation. Liver: No focal lesion identified. Within normal limits in parenchymal echogenicity. IVC: No abnormality visualized. Pancreas: No mass or inflammatory focus. Spleen: Size and appearance within normal limits. Right Kidney: Length: 10.6 cm. Echogenicity within normal limits. No mass or  hydronephrosis visualized. Left Kidney: Length: 10.9 cm. Echogenicity within normal limits. No mass or hydronephrosis visualized. Abdominal aorta: No aneurysm visualized. Other findings: No demonstrable ascites. IMPRESSION: Study within normal limits. Electronically Signed   By: Bretta Bang III M.D.   On: 02/01/2015 11:33   I have personally reviewed and evaluated these images and lab results as part of my medical decision-making.   EKG Interpretation None     Meds given in ED:  Medications - No data to display  New Prescriptions   No medications on file   Filed Vitals:   02/01/15 0952 02/01/15 1305  BP: 158/78 141/72  Pulse: 92 77  Temp: 98.1 F (36.7 C) 98.6 F (37 C)  TempSrc: Oral Oral  Resp: 16 18  Height:  (1.676 m)   Weight: 106.595 kg   SpO2: 100% 100%    MDM  Darlene Ayala is a 25 y.o. female who comes in for evaluation of ongoing right low back pain. Pain is replicated with palpation over right lumbar paraspinal musculature. Benign abdominal exam. Full active range of motion and neurovascularly intact. Due to patient's ongoing discomfort, we will evaluate for UTI, renal stone, other right upper quadrant pathology. Obtained a renal and right upper quadrant ultrasound which was negative. Labs are unremarkable and there is no evidence of urinary infection. I believe patient's discomfort is due to musculoskeletal etiology. No pathologic back pain red flags. Doubt any other intra-abdominal or GYN pathology. Encourage continued use of anti-inflammatories at home and follow-up with PCP. Patient verbalizes understanding and agrees with this plan. Overall, patient appears well, hemodynamically stable, afebrile, nontoxic and appropriate for discharge. Final diagnoses:  Right-sided low back pain without sciatica        Joycie Peek, PA-C 02/01/15 1319  Melene Plan, DO 02/01/15 1520

## 2015-02-01 NOTE — Discharge Instructions (Signed)
There does not appear to be an emergent cause for your back pain at this time. Your exam, labs and ultrasound were all very reassuring and showed no immediate or emergent causes for your symptoms at this time. Please continue taking your anti-inflammatories as we discussed. Follow up with your doctor as needed for reevaluation. Return to ED for any new or worsening symptoms.  Back Pain, Adult Back pain is very common in adults.The cause of back pain is rarely dangerous and the pain often gets better over time.The cause of your back pain may not be known. Some common causes of back pain include:  Strain of the muscles or ligaments supporting the spine.  Wear and tear (degeneration) of the spinal disks.  Arthritis.  Direct injury to the back. For many people, back pain may return. Since back pain is rarely dangerous, most people can learn to manage this condition on their own. HOME CARE INSTRUCTIONS Watch your back pain for any changes. The following actions may help to lessen any discomfort you are feeling:  Remain active. It is stressful on your back to sit or stand in one place for long periods of time. Do not sit, drive, or stand in one place for more than 30 minutes at a time. Take short walks on even surfaces as soon as you are able.Try to increase the length of time you walk each day.  Exercise regularly as directed by your health care provider. Exercise helps your back heal faster. It also helps avoid future injury by keeping your muscles strong and flexible.  Do not stay in bed.Resting more than 1-2 days can delay your recovery.  Pay attention to your body when you bend and lift. The most comfortable positions are those that put less stress on your recovering back. Always use proper lifting techniques, including:  Bending your knees.  Keeping the load close to your body.  Avoiding twisting.  Find a comfortable position to sleep. Use a firm mattress and lie on your side with  your knees slightly bent. If you lie on your back, put a pillow under your knees.  Avoid feeling anxious or stressed.Stress increases muscle tension and can worsen back pain.It is important to recognize when you are anxious or stressed and learn ways to manage it, such as with exercise.  Take medicines only as directed by your health care provider. Over-the-counter medicines to reduce pain and inflammation are often the most helpful.Your health care provider may prescribe muscle relaxant drugs.These medicines help dull your pain so you can more quickly return to your normal activities and healthy exercise.  Apply ice to the injured area:  Put ice in a plastic bag.  Place a towel between your skin and the bag.  Leave the ice on for 20 minutes, 2-3 times a day for the first 2-3 days. After that, ice and heat may be alternated to reduce pain and spasms.  Maintain a healthy weight. Excess weight puts extra stress on your back and makes it difficult to maintain good posture. SEEK MEDICAL CARE IF:  You have pain that is not relieved with rest or medicine.  You have increasing pain going down into the legs or buttocks.  You have pain that does not improve in one week.  You have night pain.  You lose weight.  You have a fever or chills. SEEK IMMEDIATE MEDICAL CARE IF:   You develop new bowel or bladder control problems.  You have unusual weakness or numbness in your arms  or legs.  You develop nausea or vomiting.  You develop abdominal pain.  You feel faint.   This information is not intended to replace advice given to you by your health care provider. Make sure you discuss any questions you have with your health care provider.   Document Released: 12/26/2004 Document Revised: 01/16/2014 Document Reviewed: 04/29/2013 Elsevier Interactive Patient Education [employer].

## 2015-03-14 ENCOUNTER — Encounter (HOSPITAL_BASED_OUTPATIENT_CLINIC_OR_DEPARTMENT_OTHER): Payer: Self-pay | Admitting: *Deleted

## 2015-03-14 ENCOUNTER — Emergency Department (HOSPITAL_BASED_OUTPATIENT_CLINIC_OR_DEPARTMENT_OTHER)
Admission: EM | Admit: 2015-03-14 | Discharge: 2015-03-14 | Disposition: A | Discharge status: Home / Self Care | Payer: No Typology Code available for payment source | Attending: Emergency Medicine | Admitting: Emergency Medicine

## 2015-03-14 DIAGNOSIS — K0381 Cracked tooth: Secondary | ICD-10-CM | POA: Insufficient documentation

## 2015-03-14 DIAGNOSIS — K029 Dental caries, unspecified: Secondary | ICD-10-CM | POA: Insufficient documentation

## 2015-03-14 DIAGNOSIS — Z8619 Personal history of other infectious and parasitic diseases: Secondary | ICD-10-CM | POA: Insufficient documentation

## 2015-03-14 DIAGNOSIS — K0889 Other specified disorders of teeth and supporting structures: Secondary | ICD-10-CM | POA: Insufficient documentation

## 2015-03-14 MED ORDER — ACETAMINOPHEN 325 MG PO TABS
650.0000 mg | ORAL_TABLET | Freq: Once | ORAL | Status: AC
  Administered 2015-03-14: 650 mg via ORAL
  Filled 2015-03-14: qty 2

## 2015-03-14 MED ORDER — IBUPROFEN 800 MG PO TABS
800.0000 mg | ORAL_TABLET | Freq: Once | ORAL | Status: AC
  Administered 2015-03-14: 800 mg via ORAL
  Filled 2015-03-14: qty 1

## 2015-03-14 MED ORDER — PENICILLIN V POTASSIUM 500 MG PO TABS: 500.0000 mg | ORAL_TABLET | Freq: Four times a day (QID) | ORAL | Status: AC

## 2015-03-14 MED ORDER — IBUPROFEN 800 MG PO TABS: 800.0000 mg | ORAL_TABLET | Freq: Three times a day (TID) | ORAL | Status: DC

## 2015-03-14 NOTE — ED Notes (Signed)
Presents today with pain at left lower teeth area. States chipped tooth approx 2 weeks ago, pain onset was Friday.

## 2015-03-14 NOTE — ED Notes (Signed)
Pt states she has been experiencing left lower dental pain that began on 03/12/15 - pt admits that the tooth "broke off" x2 weeks ago. Denies any fever.

## 2015-03-14 NOTE — Discharge Instructions (Signed)
Dental Pain °Dental pain may be caused by many things, including: °· Tooth decay (cavities or caries). Cavities expose the nerve of your tooth to air and hot or cold temperatures. This can cause pain or discomfort. °· Abscess or infection. A dental abscess is a collection of infected pus from a bacterial infection in the inner part of the tooth (pulp). It usually occurs at the end of the tooth's root. °· Injury. °· An unknown reason (idiopathic). °Your pain may be mild or severe. It may only occur when: °· You are chewing. °· You are exposed to hot or cold temperature. °· You are eating or drinking sugary foods or beverages, such as soda or candy. °Your pain may also be constant. °HOME CARE INSTRUCTIONS °Watch your dental pain for any changes. The following actions may help to lessen any discomfort that you are feeling: °· Take medicines only as directed by your dentist. °· If you were prescribed an antibiotic medicine, finish all of it even if you start to feel better. °· Keep all follow-up visits as directed by your dentist. This is important. °· Do not apply heat to the outside of your face. °· Rinse your mouth or gargle with salt water if directed by your dentist. This helps with pain and swelling. °¨ You can make salt water by adding ¼ tsp of salt to 1 cup of warm water. °· Apply ice to the painful area of your face: °¨ Put ice in a plastic bag. °¨ Place a towel between your skin and the bag. °¨ Leave the ice on for 20 minutes, 2-3 times per day. °· Avoid foods or drinks that cause you pain, such as: °¨ Very hot or very cold foods or drinks. °¨ Sweet or sugary foods or drinks. °SEEK MEDICAL CARE IF: °· Your pain is not controlled with medicines. °· Your symptoms are worse. °· You have new symptoms. °SEEK IMMEDIATE MEDICAL CARE IF: °· You are unable to open your mouth. °· You are having trouble breathing or swallowing. °· You have a fever. °· Your face, neck, or jaw is swollen. °  °This information is not  intended to replace advice given to you by your health care provider. Make sure you discuss any questions you have with your health care provider. °  °Document Released: 12/26/2004 Document Revised: 05/12/2014 Document Reviewed: 12/22/2013 °Elsevier Interactive Patient Education ©2016 Elsevier Inc. ° °Community Resource Guide Dental °The [employer]’s “211” is a great source of information about community services available.  Access by dialing 2-1-1 from anywhere in [place], or by website -  www.nc211.org.  ° °Other Local Resources (Updated 01/2015) ° °Dental  Care °  °Services ° °  °Phone Number and Address  °Cost  °[facility] For children 0 - 21 years of age:  °• Cleaning °• Tooth brushing/flossing instruction °• Sealants, fillings, crowns °• Extractions °• Emergency treatment  [phone] °[address] °[place], [place] [address] Charges based on family income.  Medicaid and some insurance plans accepted.   °  °[facility] [place] • Cleaning °• Sealants, fillings, crowns °• Extractions °• Emergency treatment [phone] °[address] °[place], [place] ° Pregnant women 18 years of age or older with a Medicaid card  °[facility] [place] • Cleaning °• Sealants, fillings, crowns °• Extractions °• Emergency treatment [phone] °[address] °[place], [place] Pregnant women 18 years of age or older with a   Medicaid card  °[facility] For children 0 - 21 years of age:  °• Cleaning °• Tooth brushing/flossing instruction °• Sealants, fillings, crowns °• Extractions °• Emergency treatment °Limited orthodontic services for patients with Medicaid [phone] °[address] °[place], [place] [address] Medicaid and [place] Health Choice cover for children up to age 21 and pregnant women.  Parents of children up to age 21 without Medicaid pay a  reduced fee at time of service.  °[facility] [place] For children 0 - 21 years of age:  °• Cleaning °• Tooth brushing/flossing instruction °• Sealants, fillings, crowns °• Extractions °• Emergency treatment °Limited orthodontic services for patients with Medicaid [phone] °[address] °[place], [place].  Medicaid and [place] Health Choice cover for children up to age 21 and pregnant women.  Parents of children up to age 21 without Medicaid pay a reduced fee.  °[facility] • Cleaning °• Sealants, fillings, crowns °• Extractions ° °Hours: Tuesdays and Thursdays, 4:15 - 8 pm [phone] °[address] °[place], [place] [address] Services free of charge to  County residents ages 18-64 who do not have health insurance, Medicare, Medicaid, or VA benefits and fall within federal poverty guidelines  °[facility] ° ° ° Provides dental care in addition to primary medical care, nutritional counseling, and pharmacy: °• Cleaning °• Sealants, fillings, crowns °• Extractions ° ° ° ° ° ° ° ° ° ° ° ° ° ° ° ° ° [phone] °[facility], [address] °[place], [place] ° °[phone] °[facility], [address] [place], [place] ° °[phone] °[facility] °[place], [place] ° °[phone] °[facility], [address] °[place], [place] ° °[phone] °[facility] °[address] °[place], [place] Accepts Medicaid, Medicare, most insurance.  Also provides services available to all with fees adjusted based on ability to pay.    °[facility] • Cleaning °• Tooth brushing/flossing instruction °• Sealants, fillings, crowns °• Extractions °• Emergency treatment °Hours: Tuesdays, Thursdays, and Fridays from 8 am to 5 pm by appointment only. [phone] °[address] °[place], [place]  [address] [place] residents with Medicaid (depending on eligibility) and children with [place] Health Choice - call for more information.  °[facility] • Extractions only ° °Hours: 2nd and 4th Thursday of each month from 6:30 am - 9 am.   [phone]. [phone] °[address] °[place],  [address] Ages 18 and older only.  Patients are seen on a first come, first served basis.  °[facility] • Cleanings °• Fillings °• Extractions °• Orthodontics °• Endodontics °• Implants/Crowns/Bridges °• Complete and partial dentures [phone] °[place],  Patients must complete an application for services.  There is often a waiting list.   ° °

## 2015-03-14 NOTE — ED Notes (Signed)
DC instructions reviewed with pt, also discussed the two Rx as written by ED provider, stressed importance of taking abx as prescribed and to make follow up appoint with DDS. Opportunity for questions provided

## 2015-03-14 NOTE — ED Provider Notes (Signed)
CSN: [id number]     Arrival date & time 03/14/15  1057 History   First MD Initiated Contact with Patient 03/14/15 1110     Chief Complaint  Patient presents with  . Dental Pain     (Consider location/radiation/quality/duration/timing/severity/associated sxs/prior Treatment) HPI Darlene Ayala is a 25 y.o. female with no significant PMH who presents with gradual onset, constant, moderate, worsening dental pain x 2 days.  Patient reports he left lower molar "broke off" approximately 2 weeks ago, but her pain started 2 days ago.  She has tried aspirin with minimal relief.  She does not have a dentist.  Denies fever, chills, tongue/facial swelling, difficulty swallowing, N/V.  Past Medical History  Diagnosis Date  . Shingles    History reviewed. No pertinent past surgical history. History reviewed. No pertinent family history. Social History  Substance Use Topics  . Smoking status: Never Smoker   . Smokeless tobacco: None  . Alcohol Use: No   OB History    No data available     Review of Systems All other systems negative unless otherwise stated in HPI    Allergies  Shellfish allergy  Home Medications   Prior to Admission medications   Medication Sig Start Date End Date Taking? Authorizing Provider  ibuprofen (ADVIL,MOTRIN) 800 MG tablet Take 1 tablet (800 mg total) by mouth 3 (three) times daily. 03/14/15   Cheri Fowler, PA-C  penicillin v potassium (VEETID) 500 MG tablet Take 1 tablet (500 mg total) by mouth 4 (four) times daily. 03/14/15 03/21/15  Ipek Westra, PA-C   BP 157/83 mmHg  Pulse 84  Temp(Src) 99.1 F (37.3 C) (Oral)  Resp 18  Ht  (1.676 m)  Wt 113.399 kg  BMI 40.37 kg/m2  SpO2 100%  LMP 01/19/2015 (Approximate) Physical Exam  Constitutional: She is oriented to person, place, and time. She appears well-developed and well-nourished.  HENT:  Head: Normocephalic and atraumatic.  Mouth/Throat: Uvula is midline, oropharynx is clear and moist and mucous  membranes are normal. No trismus in the jaw. Abnormal dentition. Dental caries present.  Lower left 2nd molar chipped.  No obvious dental abscess.  No visualization of dentin or nerve root. No tongue swelling or facial swelling.  Airway patent. Patient tolerating secretions without difficulty.   Eyes: Conjunctivae are normal.  Neck: Normal range of motion. Neck supple.  No evidence of Ludwigs angina.  Cardiovascular: Normal rate, regular rhythm and normal heart sounds.   Pulmonary/Chest: Effort normal and breath sounds normal.  Abdominal: Bowel sounds are normal. She exhibits no distension.  Musculoskeletal:  Moves all extremities spontaneously.  Lymphadenopathy:    She has no cervical adenopathy.  Neurological: She is alert and oriented to person, place, and time.  Speech clear without dysarthria.   Skin: Skin is warm and dry.    ED Course  Procedures (including critical care time) Labs Review Labs Reviewed - No data to display  Imaging Review No results found. I have personally reviewed and evaluated these images and lab results as part of my medical decision-making.   EKG Interpretation None      MDM   Final diagnoses:  Pain due to dental caries    Suspect dental pain associated with dental infection and possible dental abscess with patient afebrile, non toxic appearing and swallowing secretions well. I gave patient referral to dentist and stressed the importance of dental follow up for ultimate management of dental pain. Discussed return precautions.  Patient expresses understanding and agrees with plan.  I will also give penicillin VK and pain control.      Cheri FowlerKayla Terrill Wauters, PA-C 03/14/15 1144  Lavera Guiseana Duo Liu, MD 03/14/15 [phone]

## 2015-08-19 ENCOUNTER — Emergency Department (HOSPITAL_BASED_OUTPATIENT_CLINIC_OR_DEPARTMENT_OTHER)
Admission: EM | Admit: 2015-08-19 | Discharge: 2015-08-20 | Disposition: A | Discharge status: Home / Self Care | Payer: No Typology Code available for payment source | Attending: Emergency Medicine | Admitting: Emergency Medicine

## 2015-08-19 ENCOUNTER — Encounter (HOSPITAL_BASED_OUTPATIENT_CLINIC_OR_DEPARTMENT_OTHER): Payer: Self-pay | Admitting: *Deleted

## 2015-08-19 DIAGNOSIS — L298 Other pruritus: Secondary | ICD-10-CM | POA: Insufficient documentation

## 2015-08-19 DIAGNOSIS — R300 Dysuria: Secondary | ICD-10-CM | POA: Insufficient documentation

## 2015-08-19 LAB — PREGNANCY, URINE: Preg Test, Ur: NEGATIVE

## 2015-08-19 LAB — URINALYSIS, ROUTINE W REFLEX MICROSCOPIC
Bilirubin Urine: NEGATIVE
GLUCOSE, UA: NEGATIVE mg/dL
Hgb urine dipstick: NEGATIVE
Ketones, ur: NEGATIVE mg/dL
LEUKOCYTES UA: NEGATIVE
Nitrite: NEGATIVE
PH: 6 (ref 5.0–8.0)
PROTEIN: NEGATIVE mg/dL
Specific Gravity, Urine: 1.028 (ref 1.005–1.030)

## 2015-08-19 LAB — WET PREP, GENITAL
CLUE CELLS WET PREP: NONE SEEN
Sperm: NONE SEEN
TRICH WET PREP: NONE SEEN
Yeast Wet Prep HPF POC: NONE SEEN

## 2015-08-19 NOTE — ED Provider Notes (Signed)
[facility] Provider Note   CSN: [id number] Arrival date & time: 08/19/15  2250  First Provider Contact:   First MD Initiated Contact with Patient 08/19/15 2309     By signing my name below, I, Suzan SlickAshley N. Elon SpannerLeger and Nelwyn SalisburyJoshua Fowler, attest that this documentation has been prepared under the direction and in the presence of Lavera Guiseana Duo Coralee Edberg, MD.  Electronically Signed: Suzan SlickAshley N. Elon SpannerLeger, ED Scribe. 08/19/15. 11:15 PM.   History   Chief Complaint Chief Complaint  Patient presents with  . Dysuria    HPI   HPI Comments: Darlene Ayala is a 25 y.o. female who presents to the Emergency Department  complaining of constant unchanged dysuria onset last 3  days. She also reports an itching sensation to vaginal area with irritation of skin. Pt denies any PMHx of UTI. She indicates she has not switched soaps or detergents. No rashes or lesions. Pt denies fever, diaphoresis, nausea, vomiting, diarrhea, stomach pain, CVA pain, vaginal bleeding, or vaginal discharge.   Past Medical History:  Diagnosis Date  . Shingles     There are no active problems to display for this patient.   History reviewed. No pertinent surgical history.  OB History    No data available       Home Medications    Prior to Admission medications   Medication Sig Start Date End Date Taking? Authorizing Provider  ibuprofen (ADVIL,MOTRIN) 800 MG tablet Take 1 tablet (800 mg total) by mouth 3 (three) times daily. 03/14/15   Cheri FowlerKayla Rose, PA-C    Family History No family history on file.  Social History Social History  Substance Use Topics  . Smoking status: Never Smoker  . Smokeless tobacco: Never Used  . Alcohol use No     Allergies   Shellfish allergy   Review of Systems Review of Systems  10/14 Systems reviewed and all are negative for acute change except as noted in the HPI.  Physical Exam Updated Vital Signs BP 146/76 (BP Location: Left Arm)   Pulse 87   Temp 98.2 F (36.8 C) (Oral)    Resp 20   Ht 5\' 6"  (1.676 m)   Wt 241 lb (109.3 kg)   LMP 07/23/2015   SpO2 100%   BMI 38.90 kg/m   Physical Exam Physical Exam  Nursing note and vitals reviewed. Constitutional: Well developed, well nourished, non-toxic, and in no acute distress Head: Normocephalic and atraumatic.  Mouth/Throat: Oropharynx is clear and moist.  Neck: Normal range of motion. Neck supple.  Cardiovascular: Normal rate and regular rhythm.   Pulmonary/Chest: Effort normal and breath sounds normal.  Abdominal: Soft. There is no tenderness. There is no rebound and no guarding.  Musculoskeletal: Normal range of motion.  Neurological: Alert, no facial droop, fluent speech, moves all extremities symmetrically Skin: Skin is warm and dry.  Psychiatric: Cooperative Pelvic: Normal external genitalia. No lesions. Normal internal genitalia. White vaginal discharge. No blood within the vagina.     ED Treatments / Results  DIAGNOSTIC STUDIES: Oxygen Saturation is 100% on RA, normal by my interpretation.    COORDINATION OF CARE: 11:14 PM-Discussed treatment plan with pt at bedside which included a pelvic exam and pt agreed to plan.     Labs (all labs ordered are listed, but only abnormal results are displayed) Labs Reviewed  URINALYSIS, ROUTINE W REFLEX MICROSCOPIC (NOT AT [facility])  PREGNANCY, URINE    EKG  EKG Interpretation None       Radiology No results found.  Procedures Procedures (including critical care time)  Medications Ordered in ED Medications - No data to display   Initial Impression / Assessment and Plan / ED Course  I have reviewed the triage vital signs and the nursing notes.  Pertinent labs & imaging results that were available during my care of the patient were reviewed by me and considered in my medical decision making (see chart for details).  Clinical Course    With 3 days dysuria and vaginal itching/irritation. Well appearing with normal vital signs. Abdomen  benign. Unremarkable pelvic exam. UA without infection. Not pregnant. Wet prep without yeast for vaginitis. Did screen for GC/chlamydia. She does not want empiric treatment for STD for now and will wait for results.   Strict return and follow-up instructions reviewed. She expressed understanding of all discharge instructions and felt comfortable with the plan of care.  The patient appears reasonably screened and/or stabilized for discharge and I doubt any other medical condition or other [facility] requiring further screening, evaluation, or treatment in the ED at this time prior to discharge.   Final Clinical Impressions(s) / ED Diagnoses   Final diagnoses:  None    New Prescriptions New Prescriptions   No medications on file   I personally performed the services described in this documentation, which was scribed in my presence. The recorded information has been reviewed and is accurate.    Lavera Guise, MD 08/20/15 [phone]

## 2015-08-19 NOTE — ED Triage Notes (Signed)
Dysuria x 3 days. Vaginal burning and itching.

## 2015-08-20 LAB — GC/CHLAMYDIA PROBE AMP (~~LOC~~) NOT AT ARMC
CHLAMYDIA, DNA PROBE: NEGATIVE
NEISSERIA GONORRHEA: NEGATIVE

## 2015-08-20 NOTE — Discharge Instructions (Signed)
Your urine does not show infection today. Your wet prep does not show vaginitis or yeast. We did send STD testing and will call if it does return positive.  Return for worsening symptoms, including fever, abdominal pain, severe back pain or any other symptoms concerning to you.

## 2015-12-28 ENCOUNTER — Emergency Department (HOSPITAL_BASED_OUTPATIENT_CLINIC_OR_DEPARTMENT_OTHER)
Admission: EM | Admit: 2015-12-28 | Discharge: 2015-12-29 | Disposition: A | Discharge status: Home / Self Care | Payer: No Typology Code available for payment source | Attending: Emergency Medicine | Admitting: Emergency Medicine

## 2015-12-28 ENCOUNTER — Encounter (HOSPITAL_BASED_OUTPATIENT_CLINIC_OR_DEPARTMENT_OTHER): Payer: Self-pay | Admitting: *Deleted

## 2015-12-28 DIAGNOSIS — Z202 Contact with and (suspected) exposure to infections with a predominantly sexual mode of transmission: Secondary | ICD-10-CM | POA: Insufficient documentation

## 2015-12-28 DIAGNOSIS — N760 Acute vaginitis: Secondary | ICD-10-CM | POA: Insufficient documentation

## 2015-12-28 DIAGNOSIS — B9689 Other specified bacterial agents as the cause of diseases classified elsewhere: Secondary | ICD-10-CM

## 2015-12-28 NOTE — ED Provider Notes (Signed)
[facility] Note   CSN: [id number] Arrival date & time: 12/28/15  2333  By signing my name below, I, Emmanuella Mensah, attest that this documentation has been prepared under the direction and in the presence of Tomasita CrumbleAdeleke Ayesha Markwell, MD. Electronically Signed: Angelene GiovanniEmmanuella Mensah, ED Scribe. 12/28/15. 12:09 AM.   History   Chief Complaint Chief Complaint  Patient presents with  . Vaginal Discharge    HPI Comments: Darlene Ayala is a 25 y.o. female who presents to the Emergency Department complaining of gradual onset, moderate white vaginal discharge onset 2 weeks ago. She reports associated itchiness to her pelvic region. No alleviating or exacerbating factors noted. Pt has not tried any medications PTA. She has NKDA; pt has an allergy to shellfish. She reports that her LMP was in October but states that she has PCOCS. She denies any fever, chills, abdominal pain, nausea, vomiting, or any other symptoms.    The history is provided by the patient. No language interpreter was used.    Past Medical History:  Diagnosis Date  . Shingles     There are no active problems to display for this patient.   No past surgical history on file.  OB History    No data available       Home Medications    Prior to Admission medications   Medication Sig Start Date End Date Taking? Authorizing Provider  ibuprofen (ADVIL,MOTRIN) 800 MG tablet Take 1 tablet (800 mg total) by mouth 3 (three) times daily. 03/14/15   Cheri FowlerKayla Rose, PA-C    Family History No family history on file.  Social History Social History  Substance Use Topics  . Smoking status: Never Smoker  . Smokeless tobacco: Never Used  . Alcohol use No     Allergies   Shellfish allergy   Review of Systems Review of Systems  A complete 10 system review of systems was obtained and all systems are negative except as noted in the HPI and PMH.   Physical Exam Updated Vital Signs BP 174/83 (BP Location: Left  Arm)   Pulse 77   Temp 98.2 F (36.8 C) (Oral)   Resp 18   Ht 5\' 6"  (1.676 m)   Wt 250 lb (113.4 kg)   SpO2 100%   BMI 40.35 kg/m   Physical Exam  Constitutional: She is oriented to person, place, and time. She appears well-developed and well-nourished. No distress.  HENT:  Head: Normocephalic and atraumatic.  Nose: Nose normal.  Mouth/Throat: Oropharynx is clear and moist. No oropharyngeal exudate.  Eyes: Conjunctivae and EOM are normal. Pupils are equal, round, and reactive to light. No scleral icterus.  Neck: Normal range of motion. Neck supple. No JVD present. No tracheal deviation present. No thyromegaly present.  Cardiovascular: Normal rate, regular rhythm and normal heart sounds.  Exam reveals no gallop and no friction rub.   No murmur heard. Pulmonary/Chest: Effort normal and breath sounds normal. No respiratory distress. She has no wheezes. She exhibits no tenderness.  Abdominal: Soft. Bowel sounds are normal. She exhibits no distension and no mass. There is no tenderness. There is no rebound and no guarding.  Genitourinary: Uterus normal. Vaginal discharge found.  Genitourinary Comments: No CMT or adnexal tenderness  Musculoskeletal: Normal range of motion. She exhibits no edema or tenderness.  Lymphadenopathy:    She has no cervical adenopathy.  Neurological: She is alert and oriented to person, place, and time. No cranial nerve deficit. She exhibits normal muscle tone.  Skin: Skin is  warm and dry. No rash noted. No erythema. No pallor.  Nursing note and vitals reviewed.    ED Treatments / Results  DIAGNOSTIC STUDIES: Oxygen Saturation is 100% on RA, normal by my interpretation.    COORDINATION OF CARE: 11:57 PM- Pt advised of plan for treatment and pt agrees. Pt will receive lab work for further evaluation.    Labs (all labs ordered are listed, but only abnormal results are displayed) Labs Reviewed  WET PREP, GENITAL  HIV ANTIBODY (ROUTINE TESTING)  HCG,  QUANTITATIVE, PREGNANCY  GC/CHLAMYDIA PROBE AMP ([place]) NOT AT [facility]    EKG  EKG Interpretation None       Radiology No results found.  Procedures Procedures (including critical care time)  Medications Ordered in ED Medications - No data to display   Initial Impression / Assessment and Plan / ED Course  Tomasita CrumbleAdeleke Erandy Mceachern, MD has reviewed the triage vital signs and the nursing notes.  Pertinent labs & imaging results that were available during my care of the patient were reviewed by me and considered in my medical decision making (see chart for details).  Clinical Course     Patient presents to the ED for vaginal itching and discharge, concern for STD.  PE reveals some discharge as well.  She was treated with ceftriaxone and azithro.  WP shows BV.  Given Rx for flagyl for 1 week.  PCP fu advised within 3 days.  She appears well and in NAD.  Vs remain within ehr normal limits and she is safe for DC.  Final Clinical Impressions(s) / ED Diagnoses   Final diagnoses:  None    New Prescriptions New Prescriptions   No medications on file   I personally performed the services described in this documentation, which was scribed in my presence. The recorded information has been reviewed and is accurate.      Tomasita CrumbleAdeleke Sirenia Whitis, MD 12/29/15 [phone]

## 2015-12-28 NOTE — ED Triage Notes (Signed)
Pt reports an increase in white vaginal DC in last 2 weeks. Denies abd pain fever or N/V

## 2015-12-29 LAB — WET PREP, GENITAL
SPERM: NONE SEEN
TRICH WET PREP: NONE SEEN
YEAST WET PREP: NONE SEEN

## 2015-12-29 LAB — PREGNANCY, URINE: PREG TEST UR: NEGATIVE

## 2015-12-29 MED ORDER — METRONIDAZOLE 500 MG PO TABS: 500.0000 mg | ORAL_TABLET | Freq: Two times a day (BID) | ORAL | 0 refills | Status: DC

## 2015-12-29 MED ORDER — CEFTRIAXONE SODIUM 250 MG IJ SOLR
250.0000 mg | Freq: Once | INTRAMUSCULAR | Status: AC
  Administered 2015-12-29: 250 mg via INTRAMUSCULAR
  Filled 2015-12-29: qty 250

## 2015-12-29 MED ORDER — AZITHROMYCIN 1 G PO PACK
1.0000 g | PACK | Freq: Once | ORAL | Status: AC
  Administered 2015-12-29: 1 g via ORAL
  Filled 2015-12-29: qty 1

## 2015-12-30 LAB — GC/CHLAMYDIA PROBE AMP (~~LOC~~) NOT AT ARMC
Chlamydia: NEGATIVE
Neisseria Gonorrhea: NEGATIVE

## 2015-12-30 LAB — HIV ANTIBODY (ROUTINE TESTING W REFLEX): HIV Screen 4th Generation wRfx: NONREACTIVE

## 2016-02-07 ENCOUNTER — Emergency Department (HOSPITAL_BASED_OUTPATIENT_CLINIC_OR_DEPARTMENT_OTHER)
Admission: EM | Admit: 2016-02-07 | Discharge: 2016-02-07 | Disposition: A | Discharge status: Home / Self Care | Payer: No Typology Code available for payment source | Attending: Dermatology | Admitting: Dermatology

## 2016-02-07 DIAGNOSIS — N898 Other specified noninflammatory disorders of vagina: Secondary | ICD-10-CM | POA: Insufficient documentation

## 2016-02-07 DIAGNOSIS — M549 Dorsalgia, unspecified: Secondary | ICD-10-CM | POA: Insufficient documentation

## 2016-02-07 DIAGNOSIS — Z5321 Procedure and treatment not carried out due to patient leaving prior to being seen by health care provider: Secondary | ICD-10-CM | POA: Insufficient documentation

## 2016-02-07 DIAGNOSIS — R109 Unspecified abdominal pain: Secondary | ICD-10-CM | POA: Insufficient documentation

## 2016-02-07 NOTE — ED Notes (Signed)
No answer. Another pt saw her leave.

## 2016-02-07 NOTE — ED Triage Notes (Signed)
Patient states that she was dx with BV in dec. Since then she continues to have the discharge and she reports now she is having "particles" in my urine and burning with urination

## 2016-02-08 ENCOUNTER — Emergency Department (HOSPITAL_BASED_OUTPATIENT_CLINIC_OR_DEPARTMENT_OTHER)
Admission: EM | Admit: 2016-02-08 | Discharge: 2016-02-08 | Disposition: A | Discharge status: Home / Self Care | Payer: No Typology Code available for payment source | Attending: Emergency Medicine | Admitting: Emergency Medicine

## 2016-02-08 ENCOUNTER — Encounter (HOSPITAL_BASED_OUTPATIENT_CLINIC_OR_DEPARTMENT_OTHER): Payer: Self-pay | Admitting: Emergency Medicine

## 2016-02-08 DIAGNOSIS — N898 Other specified noninflammatory disorders of vagina: Secondary | ICD-10-CM

## 2016-02-08 HISTORY — DX: Polycystic ovarian syndrome: E28.2

## 2016-02-08 LAB — URINALYSIS, ROUTINE W REFLEX MICROSCOPIC
Bilirubin Urine: NEGATIVE
Glucose, UA: NEGATIVE mg/dL
HGB URINE DIPSTICK: NEGATIVE
Ketones, ur: NEGATIVE mg/dL
Nitrite: NEGATIVE
PROTEIN: NEGATIVE mg/dL
Specific Gravity, Urine: 1.026 (ref 1.005–1.030)
pH: 6.5 (ref 5.0–8.0)

## 2016-02-08 LAB — URINALYSIS, MICROSCOPIC (REFLEX)

## 2016-02-08 LAB — WET PREP, GENITAL
Clue Cells Wet Prep HPF POC: NONE SEEN
Sperm: NONE SEEN
Trich, Wet Prep: NONE SEEN
YEAST WET PREP: NONE SEEN

## 2016-02-08 LAB — GC/CHLAMYDIA PROBE AMP (~~LOC~~) NOT AT ARMC
Chlamydia: NEGATIVE
Neisseria Gonorrhea: NEGATIVE

## 2016-02-08 LAB — PREGNANCY, URINE: PREG TEST UR: NEGATIVE

## 2016-02-08 MED ORDER — CEPHALEXIN 500 MG PO CAPS: 500.0000 mg | ORAL_CAPSULE | Freq: Four times a day (QID) | ORAL | 0 refills | Status: DC

## 2016-02-08 MED ORDER — CEFTRIAXONE SODIUM 250 MG IJ SOLR
250.0000 mg | Freq: Once | INTRAMUSCULAR | Status: AC
  Administered 2016-02-08: 250 mg via INTRAMUSCULAR
  Filled 2016-02-08: qty 250

## 2016-02-08 MED ORDER — FLUCONAZOLE 150 MG PO TABS: 150.0000 mg | ORAL_TABLET | Freq: Once | ORAL | 0 refills | Status: AC

## 2016-02-08 MED ORDER — LIDOCAINE HCL (PF) 1 % IJ SOLN
INTRAMUSCULAR | Status: AC
  Administered 2016-02-08: 04:00:00
  Filled 2016-02-08: qty 5

## 2016-02-08 MED ORDER — AZITHROMYCIN 250 MG PO TABS
1000.0000 mg | ORAL_TABLET | Freq: Once | ORAL | Status: AC
  Administered 2016-02-08: 1000 mg via ORAL
  Filled 2016-02-08: qty 4

## 2016-02-08 NOTE — ED Provider Notes (Signed)
[facility] Note   CSN: [id number] Arrival date & time: 02/07/16  2356  By signing my name below, I, Teofilo Pod, attest that this documentation has been prepared under the direction and in the presence of Glynn Octave, MD . Electronically Signed: Teofilo Pod, ED Scribe. 02/08/2016. 1:00 AM.    History   Chief Complaint Chief Complaint  Patient presents with  . Vaginal Discharge    The history is provided by the patient. No language interpreter was used.   HPI Comments:  Darlene Ayala is a 26 y.o. female who presents to the Emergency Department complaining of constant vaginal discharge x 1 month. Pt was diagnosed and treated for BV 1 month ago, and then believes that she then got a yeast infection. She states that she has been having discharge since last being seen here. Pt is unsure of her LNMP, and denies any chance of pregnancy. Pt is sexually active with 1 partner. Pt states that her urine has white particles in it. No alleviating factors noted. Pt denies fever, vomiting, abdominal pain, urinary/bowel symptoms.   Past Medical History:  Diagnosis Date  . PCOS (polycystic ovarian syndrome)   . Shingles     There are no active problems to display for this patient.   History reviewed. No pertinent surgical history.  OB History    No data available       Home Medications    Prior to Admission medications   Medication Sig Start Date End Date Taking? Authorizing Provider  metroNIDAZOLE (FLAGYL) 500 MG tablet Take 1 tablet (500 mg total) by mouth 2 (two) times daily. One po bid x 7 days 12/29/15   Tomasita Crumble, MD    Family History History reviewed. No pertinent family history.  Social History Social History  Substance Use Topics  . Smoking status: Never Smoker  . Smokeless tobacco: Never Used  . Alcohol use No     Allergies   Shellfish allergy   Review of Systems Review of Systems  Constitutional: Negative for  fever.  Gastrointestinal: Negative for abdominal pain and vomiting.  Genitourinary: Positive for vaginal discharge. Negative for dysuria and hematuria.  All other systems reviewed and are negative.    Physical Exam Updated Vital Signs BP 156/79 (BP Location: Right Arm)   Pulse 87   Temp 98.1 F (36.7 C) (Oral)   Resp 18   Ht 5\' 6"  (1.676 m)   Wt 250 lb (113.4 kg)   SpO2 100%   BMI 40.35 kg/m   Physical Exam  Constitutional: She is oriented to person, place, and time. She appears well-developed and well-nourished. No distress.  HENT:  Head: Normocephalic and atraumatic.  Mouth/Throat: Oropharynx is clear and moist. No oropharyngeal exudate.  Eyes: Conjunctivae and EOM are normal. Pupils are equal, round, and reactive to light.  Neck: Normal range of motion. Neck supple.  No meningismus.  Cardiovascular: Normal rate, regular rhythm, normal heart sounds and intact distal pulses.   No murmur heard. Pulmonary/Chest: Effort normal and breath sounds normal. No respiratory distress.  Abdominal: Soft. There is no tenderness. There is no rebound and no guarding.  No CVA tenderness.  Genitourinary: Vaginal discharge found.  Genitourinary Comments: Chaperone present. Normal external genitalia. No CMT, no adnexal tenderness  Musculoskeletal: Normal range of motion. She exhibits no edema or tenderness.  Neurological: She is alert and oriented to person, place, and time. No cranial nerve deficit. She exhibits normal muscle tone. Coordination normal.  No ataxia on  finger to nose bilaterally. No pronator drift. 5/5 strength throughout. CN 2-12 intact.Equal grip strength. Sensation intact.   Skin: Skin is warm.  Psychiatric: She has a normal mood and affect. Her behavior is normal.  Nursing note and vitals reviewed.    ED Treatments / Results  DIAGNOSTIC STUDIES:  Oxygen Saturation is 100% on RA, normal by my interpretation.    COORDINATION OF CARE:  1:00 AM Discussed treatment plan  with pt at bedside and pt agreed to plan.   Labs (all labs ordered are listed, but only abnormal results are displayed) Labs Reviewed  WET PREP, GENITAL - Abnormal; Notable for the following:       Result Value   WBC, Wet Prep HPF POC MANY (*)    All other components within normal limits  URINALYSIS, ROUTINE W REFLEX MICROSCOPIC - Abnormal; Notable for the following:    APPearance CLOUDY (*)    Leukocytes, UA SMALL (*)    All other components within normal limits  URINALYSIS, MICROSCOPIC (REFLEX) - Abnormal; Notable for the following:    Bacteria, UA MANY (*)    Squamous Epithelial / LPF 6-30 (*)    All other components within normal limits  URINE CULTURE  PREGNANCY, URINE  GC/CHLAMYDIA PROBE AMP () NOT AT [facility]    EKG  EKG Interpretation None       Radiology No results found.  Procedures Procedures (including critical care time)  Medications Ordered in ED Medications - No data to display   Initial Impression / Assessment and Plan / ED Course  I have reviewed the triage vital signs and the nursing notes.  Pertinent labs & imaging results that were available during my care of the patient were reviewed by me and considered in my medical decision making (see chart for details).     Continued vaginal discharge after being treated for BV last month. Abdomen soft and nontender.   HCG negative. UA negative.  Pelvic benign. No evidence of PID.  Treatment for cervicitis as well as possible UTI. Urine culture sent. Follow-up with health department. Return precautions discussed.  Final Clinical Impressions(s) / ED Diagnoses   Final diagnoses:  Vaginal discharge    New Prescriptions New Prescriptions   No medications on file  I personally performed the services described in this documentation, which was scribed in my presence. The recorded information has been reviewed and is accurate.    Glynn OctaveStephen Erin Uecker, MD 02/08/16 [phone]

## 2016-02-08 NOTE — Discharge Instructions (Signed)
Take the antibiotics as prescribed and followup with your doctor. Return to the ED if you develop new or worsening symptoms. °

## 2016-02-09 LAB — URINE CULTURE

## 2016-02-24 ENCOUNTER — Encounter (HOSPITAL_BASED_OUTPATIENT_CLINIC_OR_DEPARTMENT_OTHER): Payer: Self-pay | Admitting: Emergency Medicine

## 2016-02-24 ENCOUNTER — Emergency Department (HOSPITAL_BASED_OUTPATIENT_CLINIC_OR_DEPARTMENT_OTHER)
Admission: EM | Admit: 2016-02-24 | Discharge: 2016-02-24 | Disposition: A | Discharge status: Home / Self Care | Payer: BLUE CROSS/BLUE SHIELD | Attending: Emergency Medicine | Admitting: Emergency Medicine

## 2016-02-24 DIAGNOSIS — R300 Dysuria: Secondary | ICD-10-CM | POA: Diagnosis present

## 2016-02-24 DIAGNOSIS — N390 Urinary tract infection, site not specified: Secondary | ICD-10-CM | POA: Insufficient documentation

## 2016-02-24 HISTORY — DX: Urinary tract infection, site not specified: N39.0

## 2016-02-24 HISTORY — DX: Obesity, unspecified: E66.9

## 2016-02-24 LAB — URINALYSIS, ROUTINE W REFLEX MICROSCOPIC
Bilirubin Urine: NEGATIVE
GLUCOSE, UA: NEGATIVE mg/dL
Hgb urine dipstick: NEGATIVE
KETONES UR: NEGATIVE mg/dL
NITRITE: NEGATIVE
PH: 7.5 (ref 5.0–8.0)
PROTEIN: NEGATIVE mg/dL
Specific Gravity, Urine: 1.022 (ref 1.005–1.030)

## 2016-02-24 LAB — URINALYSIS, MICROSCOPIC (REFLEX): RBC / HPF: NONE SEEN RBC/hpf (ref 0–5)

## 2016-02-24 LAB — PREGNANCY, URINE: Preg Test, Ur: NEGATIVE

## 2016-02-24 MED ORDER — NITROFURANTOIN MONOHYD MACRO 100 MG PO CAPS
100.0000 mg | ORAL_CAPSULE | Freq: Once | ORAL | Status: AC
  Administered 2016-02-24: 100 mg via ORAL
  Filled 2016-02-24: qty 1

## 2016-02-24 MED ORDER — NITROFURANTOIN MONOHYD MACRO 100 MG PO CAPS: 100.0000 mg | ORAL_CAPSULE | Freq: Two times a day (BID) | ORAL | 0 refills | Status: DC

## 2016-02-24 NOTE — ED Provider Notes (Signed)
[facility] Provider Note   CSN: [id number] Arrival date & time: 02/24/16  0027     History   Chief Complaint Chief Complaint  Patient presents with  . Urinary Frequency    HPI Darlene Ayala is a 26 y.o. female.   Urinary Frequency  This is a new problem. The current episode started more than 1 week ago. The problem occurs constantly. The problem has not changed since onset.Pertinent negatives include no chest pain and no abdominal pain. Nothing aggravates the symptoms. Nothing relieves the symptoms. She has tried nothing for the symptoms. The treatment provided no relief.  Seen for UTi on the 30th got a little better than worse again, but never really resolved.  No f/c/r.    Past Medical History:  Diagnosis Date  . Obesity   . PCOS (polycystic ovarian syndrome)   . Shingles   . UTI (urinary tract infection)     There are no active problems to display for this patient.   History reviewed. No pertinent surgical history.  OB History    No data available       Home Medications    Prior to Admission medications   Medication Sig Start Date End Date Taking? Authorizing Provider  cephALEXin (KEFLEX) 500 MG capsule Take 1 capsule (500 mg total) by mouth 4 (four) times daily. 02/08/16   Glynn OctaveStephen Rancour, MD  metroNIDAZOLE (FLAGYL) 500 MG tablet Take 1 tablet (500 mg total) by mouth 2 (two) times daily. One po bid x 7 days 12/29/15   Tomasita CrumbleAdeleke Oni, MD    Family History No family history on file.  Social History Social History  Substance Use Topics  . Smoking status: Never Smoker  . Smokeless tobacco: Never Used  . Alcohol use No     Allergies   Shellfish allergy   Review of Systems Review of Systems  Constitutional: Negative for fever.  Cardiovascular: Negative for chest pain.  Gastrointestinal: Negative for abdominal pain and vomiting.  Genitourinary: Positive for dysuria and frequency. Negative for vaginal bleeding and vaginal discharge.    All other systems reviewed and are negative.    Physical Exam Updated Vital Signs BP 156/97 (BP Location: Left Arm)   Pulse 80   Temp 97.4 F (36.3 C) (Oral)   Resp 16   Ht 5\' 6"  (1.676 m)   Wt 250 lb (113.4 kg)   SpO2 100%   BMI 40.35 kg/m   Physical Exam  Constitutional: She is oriented to person, place, and time. She appears well-developed and well-nourished. No distress.  HENT:  Head: Normocephalic and atraumatic.  Mouth/Throat: No oropharyngeal exudate.  Eyes: Conjunctivae and EOM are normal. Pupils are equal, round, and reactive to light.  Neck: Normal range of motion. Neck supple.  Cardiovascular: Normal rate, regular rhythm and intact distal pulses.   Pulmonary/Chest: Effort normal and breath sounds normal. She has no wheezes. She has no rales.  Abdominal: Soft. Bowel sounds are normal. She exhibits no mass. There is no tenderness. There is no rebound and no guarding.  Musculoskeletal: Normal range of motion.  Neurological: She is alert and oriented to person, place, and time.  Skin: Skin is warm and dry. Capillary refill takes less than 2 seconds.  Psychiatric: She has a normal mood and affect.     ED Treatments / Results   Vitals:   02/24/16 0033  BP: 156/97  Pulse: 80  Resp: 16  Temp: 97.4 F (36.3 C)    Labs (all labs ordered are  listed, but only abnormal results are displayed) Labs Reviewed  URINALYSIS, ROUTINE W REFLEX MICROSCOPIC - Abnormal; Notable for the following:       Result Value   Leukocytes, UA SMALL (*)    All other components within normal limits  URINALYSIS, MICROSCOPIC (REFLEX) - Abnormal; Notable for the following:    Bacteria, UA FEW (*)    Squamous Epithelial / LPF 0-5 (*)    All other components within normal limits  PREGNANCY, URINE    EKG  EKG Interpretation None       Radiology No results found.  Procedures Procedures (including critical care time)  Medications Ordered in ED Medications  nitrofurantoin  (macrocrystal-monohydrate) (MACROBID) capsule 100 mg (100 mg Oral Given 02/24/16 0157)       Final Clinical Impressions(s) / ED Diagnoses  UTI: All questions answered to patient's satisfaction. Based on history and exam patient has been appropriately medically screened and emergency conditions excluded. Patient is stable for discharge at this time. Strict return precautions given for any further episodes, persistent fever, weakness or any concerns. New Prescriptions New Prescriptions New Prescriptions   No medications on file     Steffanie Mingle, MD 02/24/16 [phone]

## 2016-02-24 NOTE — ED Triage Notes (Signed)
Low back pain and urinary frequency.  Sts she was diagnosed with UTI here on last visit and she took the abx but the urinary sx did not change.  Low back pain started 4 days ago.

## 2016-02-24 NOTE — ED Notes (Signed)
Pt states she was here at the end of January and diagnosed with a UTI. Pt is still having frequent urination and lower back pain. Pt feels that her UTI has not gone away. Pt reports getting a script for keflex.

## 2016-06-27 ENCOUNTER — Encounter (HOSPITAL_BASED_OUTPATIENT_CLINIC_OR_DEPARTMENT_OTHER): Payer: Self-pay | Admitting: *Deleted

## 2016-06-27 ENCOUNTER — Emergency Department (HOSPITAL_BASED_OUTPATIENT_CLINIC_OR_DEPARTMENT_OTHER)
Admission: EM | Admit: 2016-06-27 | Discharge: 2016-06-27 | Disposition: A | Discharge status: Home / Self Care | Payer: BLUE CROSS/BLUE SHIELD | Attending: Emergency Medicine | Admitting: Emergency Medicine

## 2016-06-27 DIAGNOSIS — Z202 Contact with and (suspected) exposure to infections with a predominantly sexual mode of transmission: Secondary | ICD-10-CM | POA: Diagnosis present

## 2016-06-27 DIAGNOSIS — N898 Other specified noninflammatory disorders of vagina: Secondary | ICD-10-CM | POA: Diagnosis not present

## 2016-06-27 LAB — URINALYSIS, ROUTINE W REFLEX MICROSCOPIC
Bilirubin Urine: NEGATIVE
GLUCOSE, UA: NEGATIVE mg/dL
HGB URINE DIPSTICK: NEGATIVE
Ketones, ur: NEGATIVE mg/dL
Leukocytes, UA: NEGATIVE
Nitrite: NEGATIVE
PH: 6 (ref 5.0–8.0)
Protein, ur: NEGATIVE mg/dL
SPECIFIC GRAVITY, URINE: 1.022 (ref 1.005–1.030)

## 2016-06-27 LAB — WET PREP, GENITAL
Clue Cells Wet Prep HPF POC: NONE SEEN
Sperm: NONE SEEN
Trich, Wet Prep: NONE SEEN
YEAST WET PREP: NONE SEEN

## 2016-06-27 LAB — PREGNANCY, URINE: Preg Test, Ur: NEGATIVE

## 2016-06-27 MED ORDER — CEFTRIAXONE SODIUM 250 MG IJ SOLR
250.0000 mg | Freq: Once | INTRAMUSCULAR | Status: AC
  Administered 2016-06-27: 250 mg via INTRAMUSCULAR
  Filled 2016-06-27: qty 250

## 2016-06-27 MED ORDER — LIDOCAINE HCL (PF) 1 % IJ SOLN
INTRAMUSCULAR | Status: AC
  Filled 2016-06-27: qty 5

## 2016-06-27 MED ORDER — AZITHROMYCIN 250 MG PO TABS
1000.0000 mg | ORAL_TABLET | Freq: Once | ORAL | Status: AC
  Administered 2016-06-27: 1000 mg via ORAL
  Filled 2016-06-27: qty 4

## 2016-06-27 NOTE — ED Provider Notes (Signed)
[facility]   CSN: [id number] Arrival date & time: 06/27/16  4098     History   Chief Complaint Chief Complaint  Patient presents with  . STD Check    HPI Darlene Ayala is a 26 y.o. female.  27 yo F with a cc of possible STD exposure. Patient has a new partner. Has been having some white thick discharge. Denies pelvic pain vaginal bleeding dysuria and flank pain fevers. She thinks it feels similar to when she had bacterial vaginosis in the past.   The history is provided by the patient.  Illness  This is a new problem. The current episode started 2 days ago. The problem occurs constantly. The problem has not changed since onset.Pertinent negatives include no chest pain, no headaches and no shortness of breath. Nothing aggravates the symptoms. Nothing relieves the symptoms. She has tried nothing for the symptoms. The treatment provided no relief.    Past Medical History:  Diagnosis Date  . Obesity   . PCOS (polycystic ovarian syndrome)   . Shingles   . UTI (urinary tract infection)     There are no active problems to display for this patient.   History reviewed. No pertinent surgical history.  OB History    No data available       Home Medications    Prior to Admission medications   Not on File    Family History History reviewed. No pertinent family history.  Social History Social History  Substance Use Topics  . Smoking status: Never Smoker  . Smokeless tobacco: Never Used  . Alcohol use No     Allergies   Shellfish allergy   Review of Systems Review of Systems  Constitutional: Negative for chills and fever.  HENT: Negative for congestion and rhinorrhea.   Eyes: Negative for redness and visual disturbance.  Respiratory: Negative for shortness of breath and wheezing.   Cardiovascular: Negative for chest pain and palpitations.  Gastrointestinal: Negative for nausea and vomiting.  Genitourinary: Positive for vaginal  discharge. Negative for dysuria and urgency.  Musculoskeletal: Negative for arthralgias and myalgias.  Skin: Negative for pallor and wound.  Neurological: Negative for dizziness and headaches.     Physical Exam Updated Vital Signs BP 130/79 (BP Location: Right Arm)   Pulse 68   Temp 98.1 F (36.7 C) (Oral)   Resp 18   Ht 5\' 6"  (1.676 m)   Wt 112 kg (247 lb)   SpO2 98%   BMI 39.87 kg/m   Physical Exam  Constitutional: She is oriented to person, place, and time. She appears well-developed and well-nourished. No distress.  HENT:  Head: Normocephalic and atraumatic.  Eyes: EOM are normal. Pupils are equal, round, and reactive to light.  Neck: Normal range of motion. Neck supple.  Cardiovascular: Normal rate and regular rhythm.  Exam reveals no gallop and no friction rub.   No murmur heard. Pulmonary/Chest: Effort normal. She has no wheezes. She has no rales.  Abdominal: Soft. She exhibits no distension and no mass. There is no tenderness. There is no guarding.  Genitourinary: Cervix exhibits discharge (whitish). Cervix exhibits no motion tenderness and no friability. Right adnexum displays no mass, no tenderness and no fullness. Left adnexum displays no mass, no tenderness and no fullness.  Musculoskeletal: She exhibits no edema or tenderness.  Neurological: She is alert and oriented to person, place, and time.  Skin: Skin is warm and dry. She is not diaphoretic.  Psychiatric: She has a normal  mood and affect. Her behavior is normal.  Nursing note and vitals reviewed.    ED Treatments / Results  Labs (all labs ordered are listed, but only abnormal results are displayed) Labs Reviewed  WET PREP, GENITAL - Abnormal; Notable for the following:       Result Value   WBC, Wet Prep HPF POC MANY (*)    All other components within normal limits  PREGNANCY, URINE  URINALYSIS, ROUTINE W REFLEX MICROSCOPIC  HIV ANTIBODY (ROUTINE TESTING)  RPR  GC/CHLAMYDIA PROBE AMP ([place])  [facility]    EKG  EKG Interpretation None       Radiology No results found.  Procedures Procedures (including critical care time)  Medications Ordered in ED Medications  lidocaine (PF) (XYLOCAINE) 1 % injection (not administered)  cefTRIAXone (ROCEPHIN) injection 250 mg (250 mg Intramuscular Given 06/27/16 0724)  azithromycin (ZITHROMAX) tablet 1,000 mg (1,000 mg Oral Given 06/27/16 0724)     Initial Impression / Assessment and Plan / ED Course  I have reviewed the triage vital signs and the nursing notes.  Pertinent labs & imaging results that were available during my care of the patient were reviewed by me and considered in my medical decision making (see chart for details).     26 yo F with a cc of possible STD exposure.  Will treat.    7:38 AM:  I have discussed the diagnosis/risks/treatment options with the patient and believe the pt to be eligible for discharge home to follow-up with PCP/OBGYN. We also discussed returning to the ED immediately if new or worsening sx occur. We discussed the sx which are most concerning (e.g., sudden worsening pain, fever, inability to tolerate by mouth) that necessitate immediate return. Medications administered to the patient during their visit and any new prescriptions provided to the patient are listed below.  Medications given during this visit Medications  lidocaine (PF) (XYLOCAINE) 1 % injection (not administered)  cefTRIAXone (ROCEPHIN) injection 250 mg (250 mg Intramuscular Given 06/27/16 0724)  azithromycin (ZITHROMAX) tablet 1,000 mg (1,000 mg Oral Given 06/27/16 0724)     The patient appears reasonably screen and/or stabilized for discharge and I doubt any other medical condition or other [facility] requiring further screening, evaluation, or treatment in the ED at this time prior to discharge.   Final Clinical Impressions(s) / ED Diagnoses   Final diagnoses:  Vaginal discharge    New Prescriptions New Prescriptions   No  medications on file     Melene PlanFloyd, Winson Eichorn, DO 06/27/16 [id number]

## 2016-06-27 NOTE — ED Triage Notes (Signed)
Pt with vaginal DC x 3-4 days states that she is concerned and wants to be tested for STD's BV and HIV.

## 2016-06-27 NOTE — Discharge Instructions (Signed)
Follow-up with your OBGYN

## 2016-06-28 LAB — GC/CHLAMYDIA PROBE AMP (~~LOC~~) NOT AT ARMC
Chlamydia: NEGATIVE
Neisseria Gonorrhea: NEGATIVE

## 2016-06-28 LAB — RPR: RPR Ser Ql: NONREACTIVE

## 2016-06-28 LAB — HIV ANTIBODY (ROUTINE TESTING W REFLEX): HIV Screen 4th Generation wRfx: NONREACTIVE

## 2016-09-25 DIAGNOSIS — Z202 Contact with and (suspected) exposure to infections with a predominantly sexual mode of transmission: Secondary | ICD-10-CM | POA: Diagnosis present

## 2016-09-25 NOTE — ED Triage Notes (Signed)
Reports possible exposure to trichomonas. Denies any vaginal sx. Only reports diarrhea.

## 2016-09-26 ENCOUNTER — Emergency Department (HOSPITAL_BASED_OUTPATIENT_CLINIC_OR_DEPARTMENT_OTHER)
Admission: EM | Admit: 2016-09-26 | Discharge: 2016-09-26 | Disposition: A | Discharge status: Home / Self Care | Payer: BLUE CROSS/BLUE SHIELD | Attending: Emergency Medicine | Admitting: Emergency Medicine

## 2016-09-26 DIAGNOSIS — Z202 Contact with and (suspected) exposure to infections with a predominantly sexual mode of transmission: Secondary | ICD-10-CM

## 2016-09-26 DIAGNOSIS — Z711 Person with feared health complaint in whom no diagnosis is made: Secondary | ICD-10-CM

## 2016-09-26 LAB — WET PREP, GENITAL
Clue Cells Wet Prep HPF POC: NONE SEEN
Sperm: NONE SEEN
Trich, Wet Prep: NONE SEEN
YEAST WET PREP: NONE SEEN

## 2016-09-26 LAB — GC/CHLAMYDIA PROBE AMP (~~LOC~~) NOT AT ARMC
Chlamydia: NEGATIVE
Neisseria Gonorrhea: NEGATIVE

## 2016-09-26 MED ORDER — LIDOCAINE HCL 1 % IJ SOLN
INTRAMUSCULAR | Status: AC
  Administered 2016-09-26: 1.2 mL
  Filled 2016-09-26: qty 10

## 2016-09-26 MED ORDER — CEFTRIAXONE SODIUM 250 MG IJ SOLR
250.0000 mg | Freq: Once | INTRAMUSCULAR | Status: AC
  Administered 2016-09-26: 250 mg via INTRAMUSCULAR
  Filled 2016-09-26: qty 250

## 2016-09-26 MED ORDER — AZITHROMYCIN 250 MG PO TABS
1000.0000 mg | ORAL_TABLET | Freq: Once | ORAL | Status: AC
  Administered 2016-09-26: 1000 mg via ORAL
  Filled 2016-09-26: qty 4

## 2016-09-26 MED ORDER — METRONIDAZOLE 500 MG PO TABS
2000.0000 mg | ORAL_TABLET | Freq: Once | ORAL | Status: AC
  Administered 2016-09-26: 2000 mg via ORAL
  Filled 2016-09-26: qty 4

## 2016-09-26 NOTE — Discharge Instructions (Signed)
You were seen today with concern for exposure to STD. You were treated for trichomonas, gonorrhea, and chlamydia. Abstain from sexual activity for the next 10 days. Always use protection.

## 2016-09-26 NOTE — ED Provider Notes (Signed)
[facility]   CSN: [id number] Arrival date & time: 09/25/16  2316     History   Chief Complaint Chief Complaint  Patient presents with  . Exposure to STD    HPI Darlene Ayala is a 26 y.o. female.  HPI  This is a 26 year old female who presents with concerns for STD exposure. Patient reports that she may been exposed to Trichomonas. She has one sexual partner and they do not use condoms; however, her sexual partner did have an encounter with another female who has tested positive for trichomonas. Patient is asymptomatic. No vaginal discharge or abdominal pain. No dysuria.  Past Medical History:  Diagnosis Date  . Obesity   . PCOS (polycystic ovarian syndrome)   . Shingles   . UTI (urinary tract infection)     There are no active problems to display for this patient.   No past surgical history on file.  OB History    No data available       Home Medications    Prior to Admission medications   Medication Sig Start Date End Date Taking? Authorizing Provider  metroNIDAZOLE (FLAGYL) 250 MG tablet Take 250 mg by mouth 3 (three) times daily.   Yes [provider]    Family History No family history on file.  Social History Social History  Substance Use Topics  . Smoking status: Never Smoker  . Smokeless tobacco: Never Used  . Alcohol use No     Allergies   Shellfish allergy   Review of Systems Review of Systems  Constitutional: Negative for fever.  Genitourinary: Negative for dysuria, pelvic pain, vaginal discharge and vaginal pain.  All other systems reviewed and are negative.    Physical Exam Updated Vital Signs BP (!) 150/73 (BP Location: Right Arm)   Pulse 87   Temp 98.2 F (36.8 C) (Oral)   Resp 20   Ht  (1.676 m)   Wt 110.2 kg (243 lb)   SpO2 99%   BMI 39.22 kg/m   Physical Exam  Constitutional: She is oriented to person, place, and time. She appears well-developed and well-nourished. No  distress.  Obese  HENT:  Head: Normocephalic and atraumatic.  Cardiovascular: Normal rate and regular rhythm.   Pulmonary/Chest: Effort normal. No respiratory distress.  Abdominal: Soft. There is no tenderness.  Genitourinary:  Genitourinary Comments: Normal external vaginal exam, moderate white vaginal discharge noted in the vaginal vault, no cervical motion tenderness or friability noted  Neurological: She is alert and oriented to person, place, and time.  Skin: Skin is warm and dry.  Psychiatric: She has a normal mood and affect.  Nursing note and vitals reviewed.    ED Treatments / Results  Labs (all labs ordered are listed, but only abnormal results are displayed) Labs Reviewed  WET PREP, GENITAL - Abnormal; Notable for the following:       Result Value   WBC, Wet Prep HPF POC MANY (*)    All other components within normal limits  HIV ANTIBODY (ROUTINE TESTING)  RPR  GC/CHLAMYDIA PROBE AMP ([place]) NOT AT [facility]    EKG  EKG Interpretation None       Radiology No results found.  Procedures Procedures (including critical care time)  Medications Ordered in ED Medications  azithromycin (ZITHROMAX) tablet 1,000 mg (1,000 mg Oral Given 09/26/16 0154)  cefTRIAXone (ROCEPHIN) injection 250 mg (250 mg Intramuscular Given 09/26/16 0156)  metroNIDAZOLE (FLAGYL) tablet 2,000 mg (2,000 mg Oral Given 09/26/16  0155)  lidocaine (XYLOCAINE) 1 % (with pres) injection (1.2 mLs  Given 09/26/16 0156)     Initial Impression / Assessment and Plan / ED Course  I have reviewed the triage vital signs and the nursing notes.  Pertinent labs & imaging results that were available during my care of the patient were reviewed by me and considered in my medical decision making (see chart for details).     Patient presents with concerns for STD exposure. She's currently asymptomatic. Will treat for GC, chlamydia, and trichomonas. No trichomonas noted on wet prep; however, given exposure,  feel it important to treat. Recommend abstinence for the next 10 days and always using safe sex practices.  After history, exam, and medical workup I feel the patient has been appropriately medically screened and is safe for discharge home. Pertinent diagnoses were discussed with the patient. Patient was given return precautions.   Final Clinical Impressions(s) / ED Diagnoses   Final diagnoses:  Concern about STD in female without diagnosis  STD exposure    New Prescriptions New Prescriptions   No medications on file     [name], MD 09/26/16 [phone]

## 2016-09-27 LAB — RPR: RPR Ser Ql: NONREACTIVE

## 2016-09-27 LAB — HIV ANTIBODY (ROUTINE TESTING W REFLEX): HIV SCREEN 4TH GENERATION: NONREACTIVE

## 2017-09-19 ENCOUNTER — Emergency Department (HOSPITAL_BASED_OUTPATIENT_CLINIC_OR_DEPARTMENT_OTHER)
Admission: EM | Admit: 2017-09-19 | Discharge: 2017-09-19 | Disposition: A | Discharge status: Home / Self Care | Payer: BLUE CROSS/BLUE SHIELD | Attending: Emergency Medicine | Admitting: Emergency Medicine

## 2017-09-19 ENCOUNTER — Encounter (HOSPITAL_BASED_OUTPATIENT_CLINIC_OR_DEPARTMENT_OTHER): Payer: Self-pay | Admitting: Emergency Medicine

## 2017-09-19 ENCOUNTER — Other Ambulatory Visit: Payer: Self-pay

## 2017-09-19 DIAGNOSIS — R300 Dysuria: Secondary | ICD-10-CM | POA: Insufficient documentation

## 2017-09-19 LAB — URINALYSIS, ROUTINE W REFLEX MICROSCOPIC
BILIRUBIN URINE: NEGATIVE
Glucose, UA: NEGATIVE mg/dL
HGB URINE DIPSTICK: NEGATIVE
Ketones, ur: 15 mg/dL — AB
Leukocytes, UA: NEGATIVE
Nitrite: NEGATIVE
PH: 7 (ref 5.0–8.0)
Protein, ur: NEGATIVE mg/dL
Specific Gravity, Urine: 1.02 (ref 1.005–1.030)

## 2017-09-19 LAB — PREGNANCY, URINE: PREG TEST UR: NEGATIVE

## 2017-09-19 MED ORDER — PHENAZOPYRIDINE HCL 200 MG PO TABS: 200.0000 mg | ORAL_TABLET | Freq: Three times a day (TID) | ORAL | 0 refills | Status: DC

## 2017-09-19 NOTE — ED Triage Notes (Signed)
Pt states she is having burning sensation with urination and right lower back pain for the past 4 days, no fever or chills.

## 2017-09-19 NOTE — ED Provider Notes (Signed)
[facility] EMERGENCY DEPARTMENT Provider Note   CSN: [id number] Arrival date & time: 09/19/17  0405     History   Chief Complaint Chief Complaint  Patient presents with  . Dysuria    HPI Darlene Ayala is a 27 y.o. female.  The history is provided by the patient.  Dysuria    She has a history of polycystic ovarian syndrome and comes in complaining of dysuria for the last 3 days.  There is no associated urgency or tenesmus but there has been some slight urinary frequency.  She denies any vaginal discharge.  She has had some pain in the right flank area.  She denies fever or chills.  There has been no vomiting or diarrhea.  She states that she uses condoms for contraception but does admit to having episodes of unprotected intercourse.  Past Medical History:  Diagnosis Date  . Obesity   . PCOS (polycystic ovarian syndrome)   . Shingles   . UTI (urinary tract infection)     There are no active problems to display for this patient.   History reviewed. No pertinent surgical history.   OB History   None      Home Medications    Prior to Admission medications   Medication Sig Start Date End Date Taking? Authorizing Provider  metroNIDAZOLE (FLAGYL) 250 MG tablet Take 250 mg by mouth 3 (three) times daily.    [provider]    Family History No family history on file.  Social History Social History   Tobacco Use  . Smoking status: Never Smoker  . Smokeless tobacco: Never Used  Substance Use Topics  . Alcohol use: No  . Drug use: No     Allergies   Shellfish allergy   Review of Systems Review of Systems  Genitourinary: Positive for dysuria.  All other systems reviewed and are negative.    Physical Exam Updated Vital Signs BP (!) 147/85 (BP Location: Left Arm)   Pulse 94   Temp 98.4 F (36.9 C) (Oral)   Resp 20   Ht 5\' 6"  (1.676 m)   Wt 112.9 kg   SpO2 100%   BMI 40.19 kg/m   Physical Exam  Nursing note and vitals  reviewed.  27 year old female, resting comfortably and in no acute distress. Vital signs are significant for elevated systolic blood pressure. Oxygen saturation is 100%, which is normal. Head is normocephalic and atraumatic. PERRLA, EOMI. Oropharynx is clear. Neck is nontender and supple without adenopathy or JVD. Back is nontender and there is no CVA tenderness. Lungs are clear without rales, wheezes, or rhonchi. Chest is nontender. Heart has regular rate and rhythm without murmur. Abdomen is soft, flat, nontender without masses or hepatosplenomegaly and peristalsis is normoactive. Extremities have no cyanosis or edema, full range of motion is present. Skin is warm and dry without rash. Neurologic: Mental status is normal, cranial nerves are intact, there are no motor or sensory deficits.  ED Treatments / Results  Labs (all labs ordered are listed, but only abnormal results are displayed) Labs Reviewed  URINALYSIS, ROUTINE W REFLEX MICROSCOPIC - Abnormal; Notable for the following components:      Result Value   Ketones, ur 15 (*)    All other components within normal limits  PREGNANCY, URINE   Procedures Procedures   Medications Ordered in ED Medications - No data to display   Initial Impression / Assessment and Plan / ED Course  I have reviewed the triage vital  signs and the nursing notes.  Pertinent lab results that were available during my care of the patient were reviewed by me and considered in my medical decision making (see chart for details).  Dysuria with urinalysis not showing any abnormalities other than slight ketonuria.  She will be treated symptomatically with phenazopyridine, referred to [facility] outpatient clinic for follow-up.  Encouraged safe sex practices.  Old records are reviewed, and she does have prior ED visits for UTI and STD exposure.  Final Clinical Impressions(s) / ED Diagnoses   Final diagnoses:  Dysuria    ED Discharge Orders         Ordered     phenazopyridine (PYRIDIUM) 200 MG tablet  3 times daily     09/19/17 0439           Dione Booze, MD 09/19/17 [phone]

## 2017-10-02 ENCOUNTER — Emergency Department (HOSPITAL_BASED_OUTPATIENT_CLINIC_OR_DEPARTMENT_OTHER)
Admission: EM | Admit: 2017-10-02 | Discharge: 2017-10-02 | Disposition: A | Discharge status: Home / Self Care | Payer: Self-pay | Attending: Emergency Medicine | Admitting: Emergency Medicine

## 2017-10-02 ENCOUNTER — Other Ambulatory Visit: Payer: Self-pay

## 2017-10-02 ENCOUNTER — Encounter (HOSPITAL_BASED_OUTPATIENT_CLINIC_OR_DEPARTMENT_OTHER): Payer: Self-pay

## 2017-10-02 DIAGNOSIS — X58XXXA Exposure to other specified factors, initial encounter: Secondary | ICD-10-CM | POA: Insufficient documentation

## 2017-10-02 DIAGNOSIS — S3141XA Laceration without foreign body of vagina and vulva, initial encounter: Secondary | ICD-10-CM | POA: Insufficient documentation

## 2017-10-02 DIAGNOSIS — N760 Acute vaginitis: Secondary | ICD-10-CM | POA: Insufficient documentation

## 2017-10-02 DIAGNOSIS — Y939 Activity, unspecified: Secondary | ICD-10-CM | POA: Insufficient documentation

## 2017-10-02 DIAGNOSIS — B9689 Other specified bacterial agents as the cause of diseases classified elsewhere: Secondary | ICD-10-CM

## 2017-10-02 DIAGNOSIS — Z3202 Encounter for pregnancy test, result negative: Secondary | ICD-10-CM | POA: Insufficient documentation

## 2017-10-02 DIAGNOSIS — Y929 Unspecified place or not applicable: Secondary | ICD-10-CM | POA: Insufficient documentation

## 2017-10-02 DIAGNOSIS — Y999 Unspecified external cause status: Secondary | ICD-10-CM | POA: Insufficient documentation

## 2017-10-02 LAB — URINALYSIS, ROUTINE W REFLEX MICROSCOPIC
Bilirubin Urine: NEGATIVE
Glucose, UA: NEGATIVE mg/dL
KETONES UR: NEGATIVE mg/dL
LEUKOCYTES UA: NEGATIVE
Nitrite: NEGATIVE
Protein, ur: NEGATIVE mg/dL
Specific Gravity, Urine: 1.025 (ref 1.005–1.030)
pH: 5.5 (ref 5.0–8.0)

## 2017-10-02 LAB — WET PREP, GENITAL
Sperm: NONE SEEN
TRICH WET PREP: NONE SEEN
Yeast Wet Prep HPF POC: NONE SEEN

## 2017-10-02 LAB — URINALYSIS, MICROSCOPIC (REFLEX): WBC, UA: NONE SEEN WBC/hpf (ref 0–5)

## 2017-10-02 LAB — PREGNANCY, URINE: Preg Test, Ur: NEGATIVE

## 2017-10-02 MED ORDER — METRONIDAZOLE 0.75 % EX GEL: 1.0000 "application " | Freq: Every day | CUTANEOUS | 0 refills | Status: AC

## 2017-10-02 MED ORDER — METRONIDAZOLE 0.75 % EX GEL: 1.0000 "application " | Freq: Every day | CUTANEOUS | 0 refills | Status: DC

## 2017-10-02 NOTE — ED Provider Notes (Signed)
Medical screening examination/treatment/procedure(s) were conducted as a shared visit with non-physician practitioner(s) and myself.  I personally evaluated the patient during the encounter.  None Patient has an area of burning and pain in her vaginal introitus.  Noticed it after wiping with tissue and then examined with a mirror became concerned.  Visual inspection shows a hypopigmented area at the fourchette of the vaginal introitus.  Consistent with a posterior fourchette tear that tear that is healing.   Arby BarrettePfeiffer, Kagan Mutchler, MD 10/02/17 [phone]

## 2017-10-02 NOTE — ED Triage Notes (Signed)
3 days of dysuria and vaginal discomfort, denies flank pain, denies fevers

## 2017-10-02 NOTE — Discharge Instructions (Addendum)
Please read the instructions below. Talk with your primary care provider about any new medications. Please schedule an appointment for follow up with your OBGYN or primary care. Use your antibiotic (Flagyl/Metronidazole) as prescribed. You will receive a call from the hospital if your test results come back positive. Avoid sexual activity until you know your test results. If your results come back positive, it is important that you inform all of your sexual partners. Return to the ER for new or worsening symptoms.

## 2017-10-02 NOTE — ED Provider Notes (Signed)
[facility] EMERGENCY DEPARTMENT Provider Note   CSN: [id number] Arrival date & time: 10/02/17  1622     History   Chief Complaint Chief Complaint  Patient presents with  . Vaginal Discharge    HPI Darlene Ayala is a 27 y.o. female with PMHx PCOS, presenting to the ED with complaint of 3 days of pain/rash to her vaginal region.  She states after using the restroom 3 days ago when she wiped she noticed some pain.  She states she has had only pain in that area with wiping since then.  Denies dysuria, vaginal discharge, vaginal bleeding, pelvic pain, abdominal pain, nausea or vomiting  Does note history of recurrent bacterial vaginosis, MetroGel provides her with relief.  Dates she is currently sexually active with female partners and uses protection.  Last intercourse was about 1 week ago.  The history is provided by the patient.    Past Medical History:  Diagnosis Date  . Obesity   . PCOS (polycystic ovarian syndrome)   . Shingles   . UTI (urinary tract infection)     There are no active problems to display for this patient.   History reviewed. No pertinent surgical history.   OB History   None      Home Medications    Prior to Admission medications   Medication Sig Start Date End Date Taking? Authorizing Provider  metroNIDAZOLE (METROGEL) 0.75 % gel Apply 1 application topically at bedtime for 5 days. 10/02/17 10/07/17  Zerina Hallinan, [place] N, PA-C  phenazopyridine (PYRIDIUM) 200 MG tablet Take 1 tablet (200 mg total) by mouth 3 (three) times daily. 09/19/17   Dione Booze, MD    Family History No family history on file.  Social History Social History   Tobacco Use  . Smoking status: Never Smoker  . Smokeless tobacco: Never Used  Substance Use Topics  . Alcohol use: No  . Drug use: No     Allergies   Shellfish allergy   Review of Systems Review of Systems  Gastrointestinal: Negative for abdominal pain, nausea and vomiting.  Genitourinary:  Positive for genital sores. Negative for dysuria, frequency, hematuria, vaginal bleeding and vaginal discharge.  All other systems reviewed and are negative.    Physical Exam Updated Vital Signs BP (!) 154/84 (BP Location: Left Arm)   Pulse 88   Temp 98.4 F (36.9 C) (Oral)   Resp 16   Ht 5\' 5"  (1.651 m)   Wt 112 kg   SpO2 100%   BMI 41.10 kg/m   Physical Exam  Constitutional: She appears well-developed and well-nourished. No distress.  HENT:  Head: Normocephalic and atraumatic.  Eyes: Conjunctivae are normal.  Cardiovascular: Normal rate and regular rhythm.  Pulmonary/Chest: Effort normal and breath sounds normal.  Abdominal: Soft. Bowel sounds are normal. She exhibits no distension. There is no tenderness. There is no guarding.  Genitourinary:  Genitourinary Comments: Exam performed with female nurse tech chaperone present.  There is what appears to be a healing laceration at the fourchette at the vaginal introitus.  No vesicles visualized.  Area is somewhat tender, without purulent drainage.  Vagina with small to moderate amount of light yellow discharge.  Cervix is not friable.  No CMT or adnexal tenderness.  Neurological: She is alert.  Skin: Skin is warm.  Psychiatric: She has a normal mood and affect. Her behavior is normal.  Nursing note and vitals reviewed.    ED Treatments / Results  Labs (all labs ordered are listed, but only  abnormal results are displayed) Labs Reviewed  WET PREP, GENITAL - Abnormal; Notable for the following components:      Result Value   Clue Cells Wet Prep HPF POC PRESENT (*)    WBC, Wet Prep HPF POC MODERATE (*)    All other components within normal limits  URINALYSIS, ROUTINE W REFLEX MICROSCOPIC - Abnormal; Notable for the following components:   Hgb urine dipstick SMALL (*)    All other components within normal limits  URINALYSIS, MICROSCOPIC (REFLEX) - Abnormal; Notable for the following components:   Bacteria, UA RARE (*)    All  other components within normal limits  PREGNANCY, URINE  HIV ANTIBODY (ROUTINE TESTING W REFLEX)  RPR  HSV(HERPES SIMPLEX VRS) I + II AB-IGG  HSV(HERPES SIMPLEX VRS) I + II AB-IGM  GC/CHLAMYDIA PROBE AMP ([place]) NOT [facility]    EKG None  Radiology No results found.  Procedures Procedures (including critical care time)  Medications Ordered in ED Medications - No data to display   Initial Impression / Assessment and Plan / ED Course  I have reviewed the triage vital signs and the nursing notes.  Pertinent labs & imaging results that were available during my care of the patient were reviewed by me and considered in my medical decision making (see chart for details).     Patient presenting to the ED with complaint of pain with wiping her vagina x3 days.  States she noticed a small rash and was concerned for herpes.  Is currently sexually active with female partners using protection.  No pelvic complaints.  On exam, there appears to be a healing laceration of the vaginal fourchette.  Exam not consistent with herpes infection.  There is some vaginal discharge though no CMT or adnexal tenderness, not concerning for PID.  Wet prep with clue cells and white cells, given patient's recurrent BV, will treat with MetroGel.  STD cultures sent.  Patient requesting HSV test sent, will send IgG and IgM per recommendation of Dr. Clarice PolePfeifer.  Dr. Donnald GarrePfeiffer also evaluated patient and agrees with exam findings.  Will treat symptomatically and patient is aware she will be contacted if STD results result positive.  Safe For discharge at this time.  Discussed results, findings, treatment and follow up. Patient advised of return precautions. Patient verbalized understanding and agreed with plan.  Final Clinical Impressions(s) / ED Diagnoses   Final diagnoses:  Vaginal laceration, initial encounter  BV (bacterial vaginosis)    ED Discharge Orders         Ordered    metroNIDAZOLE (METROGEL) 0.75 % gel   Daily at bedtime,   Status:  Discontinued     10/02/17 1839    metroNIDAZOLE (METROGEL) 0.75 % gel  Daily at bedtime     10/02/17 1841           Deundra Bard, [place] N, PA-C 10/02/17 1842    Arby BarrettePfeiffer, Marcy, MD 10/03/17 2035

## 2017-10-02 NOTE — ED Notes (Signed)
Pt verbalizes understanding of d/c instructions and denies any further needs at this time. 

## 2017-10-03 LAB — HSV(HERPES SIMPLEX VRS) I + II AB-IGG: HSV 2 GLYCOPROTEIN G AB, IGG: 5.99 {index} — AB (ref 0.00–0.90)

## 2017-10-03 LAB — RPR: RPR: NONREACTIVE

## 2017-10-03 LAB — GC/CHLAMYDIA PROBE AMP (~~LOC~~) NOT AT ARMC
Chlamydia: NEGATIVE
Neisseria Gonorrhea: NEGATIVE

## 2017-10-03 LAB — HSV(HERPES SIMPLEX VRS) I + II AB-IGM

## 2017-10-03 LAB — HIV ANTIBODY (ROUTINE TESTING W REFLEX): HIV SCREEN 4TH GENERATION: NONREACTIVE

## 2019-01-13 ENCOUNTER — Encounter (HOSPITAL_BASED_OUTPATIENT_CLINIC_OR_DEPARTMENT_OTHER): Payer: Self-pay | Admitting: *Deleted

## 2019-01-13 ENCOUNTER — Other Ambulatory Visit: Payer: Self-pay

## 2019-01-13 ENCOUNTER — Emergency Department (HOSPITAL_BASED_OUTPATIENT_CLINIC_OR_DEPARTMENT_OTHER)
Admission: EM | Admit: 2019-01-13 | Discharge: 2019-01-13 | Disposition: A | Discharge status: Home / Self Care | Payer: Self-pay | Attending: Emergency Medicine | Admitting: Emergency Medicine

## 2019-01-13 DIAGNOSIS — B029 Zoster without complications: Secondary | ICD-10-CM | POA: Insufficient documentation

## 2019-01-13 DIAGNOSIS — Z79899 Other long term (current) drug therapy: Secondary | ICD-10-CM | POA: Insufficient documentation

## 2019-01-13 MED ORDER — ACYCLOVIR 800 MG PO TABS: 800.0000 mg | ORAL_TABLET | Freq: Every day | ORAL | 0 refills | Status: DC

## 2019-01-13 MED ORDER — ACYCLOVIR 800 MG PO TABS: 800.0000 mg | ORAL_TABLET | Freq: Every day | ORAL | 0 refills | Status: AC

## 2019-01-13 MED FILL — ACYCLOVIR 800 MG TABLET: 800 | 7 days supply | Qty: 35 | Fill #0

## 2019-01-13 NOTE — ED Provider Notes (Signed)
[facility] EMERGENCY DEPARTMENT Provider Note   CSN: [id number] Arrival date & time: 01/13/19  1608     History Chief Complaint  Patient presents with  . Rash    Darlene Ayala is a 29 y.o. female.  HPI   29 year old female with history of PCOS, obesity, UTI, shingles, who presents emergency department today for evaluation of a rash to the left upper buttock area.  This started several days ago.  She denies any pain but states that the rash is itchy.  It started out as several bumps and has progressed to vesicles.  She denies any systemic symptoms.  Past Medical History:  Diagnosis Date  . Obesity   . PCOS (polycystic ovarian syndrome)   . Shingles   . UTI (urinary tract infection)     There are no problems to display for this patient.   History reviewed. No pertinent surgical history.   OB History   No obstetric history on file.     No family history on file.  Social History   Tobacco Use  . Smoking status: Never Smoker  . Smokeless tobacco: Never Used  Substance Use Topics  . Alcohol use: No  . Drug use: No    Home Medications Prior to Admission medications   Medication Sig Start Date End Date Taking? Authorizing Provider  acyclovir (ZOVIRAX) 800 MG tablet Take 1 tablet (800 mg total) by mouth 5 (five) times daily for 7 days. 01/13/19 01/20/19  Quanisha Drewry S, PA-C  phenazopyridine (PYRIDIUM) 200 MG tablet Take 1 tablet (200 mg total) by mouth 3 (three) times daily. 09/19/17   Dione Booze, MD    Allergies    Shellfish allergy  Review of Systems   Review of Systems  Constitutional: Negative for fever.  Eyes: Negative for visual disturbance.  Respiratory: Negative for shortness of breath.   Cardiovascular: Negative for chest pain.  Gastrointestinal: Negative for abdominal pain.  Genitourinary: Negative for pelvic pain.  Musculoskeletal: Negative for back pain.  Skin: Positive for rash.    Physical Exam Updated Vital Signs BP (!)  172/89   Pulse 94   Temp 98.7 F (37.1 C) (Oral)   Resp 18   Ht 5\' 3"  (1.6 m)   Wt 111.1 kg   SpO2 100%   BMI 43.40 kg/m   Physical Exam Vitals and nursing note reviewed.  Constitutional:      General: She is not in acute distress.    Appearance: She is well-developed.  HENT:     Head: Normocephalic and atraumatic.     Comments: No rashes to the face. Eyes:     Conjunctiva/sclera: Conjunctivae normal.  Cardiovascular:     Rate and Rhythm: Normal rate.  Pulmonary:     Effort: Pulmonary effort is normal.  Musculoskeletal:        General: Normal range of motion.     Cervical back: Neck supple.  Skin:    General: Skin is warm and dry.     Comments: Multiple vesicular lesions to the left upper buttock and area about 3 x 3 cm area.  These are nontender to palpation.  No significant surrounding erythema or other abnormalities.  Neurological:     Mental Status: She is alert.     ED Results / Procedures / Treatments   Labs (all labs ordered are listed, but only abnormal results are displayed) Labs Reviewed - No data to display  EKG None  Radiology No results found.  Procedures Procedures (including critical care  time)  Medications Ordered in ED Medications - No data to display  ED Course  I have reviewed the triage vital signs and the nursing notes.  Pertinent labs & imaging results that were available during my care of the patient were reviewed by me and considered in my medical decision making (see chart for details).    MDM Rules/Calculators/A&P                      Rashwith grouped clear vesicles on an erythematous base located along dermatome on the left upper buttock. Pain treated in the department.  Pt without signs of CNS involvement and there is no involvement of the face or eyes; no concern for opthalmic zoster.  Will discharge home with pain management and acyclovir.  Also recommend calamine lotion and cool compresses as needed for pain control  Final  Clinical Impression(s) / ED Diagnoses Final diagnoses:  Herpes zoster without complication    Rx / DC Orders ED Discharge Orders         Ordered    acyclovir (ZOVIRAX) 800 MG tablet  5 times daily,   Status:  Discontinued     01/13/19 1715    acyclovir (ZOVIRAX) 800 MG tablet  5 times daily     01/13/19 1723           Christana Angelica S, PA-C 01/13/19 2145    Davonna Belling, MD 01/14/19 [phone]

## 2019-01-13 NOTE — ED Triage Notes (Signed)
Clustered of rash/ bi listers  to left buttocks pain radiates to pelvis

## 2019-01-13 NOTE — Discharge Instructions (Signed)
Take medications as prescribed.   Please follow up with your primary care provider within 5-7 days for re-evaluation of your symptoms. If you do not have a primary care provider, information for a healthcare clinic has been provided for you to make arrangements for follow up care.   Please return to the emergency room immediately if you experience any new or worsening symptoms or any symptoms that indicate worsening infection such as fevers, increased redness/swelling/pain, warmth, or drainage from the affected area.

## 2019-10-25 ENCOUNTER — Encounter (HOSPITAL_BASED_OUTPATIENT_CLINIC_OR_DEPARTMENT_OTHER): Payer: Self-pay | Admitting: Emergency Medicine

## 2019-10-25 ENCOUNTER — Emergency Department (HOSPITAL_BASED_OUTPATIENT_CLINIC_OR_DEPARTMENT_OTHER): Payer: BLUE CROSS/BLUE SHIELD

## 2019-10-25 ENCOUNTER — Emergency Department (HOSPITAL_BASED_OUTPATIENT_CLINIC_OR_DEPARTMENT_OTHER)
Admission: EM | Admit: 2019-10-25 | Discharge: 2019-10-25 | Disposition: A | Discharge status: Home / Self Care | Payer: BLUE CROSS/BLUE SHIELD | Attending: Emergency Medicine | Admitting: Emergency Medicine

## 2019-10-25 ENCOUNTER — Other Ambulatory Visit: Payer: Self-pay

## 2019-10-25 DIAGNOSIS — N83209 Unspecified ovarian cyst, unspecified side: Secondary | ICD-10-CM

## 2019-10-25 DIAGNOSIS — N83202 Unspecified ovarian cyst, left side: Secondary | ICD-10-CM | POA: Insufficient documentation

## 2019-10-25 DIAGNOSIS — R1032 Left lower quadrant pain: Secondary | ICD-10-CM | POA: Diagnosis present

## 2019-10-25 DIAGNOSIS — R102 Pelvic and perineal pain: Secondary | ICD-10-CM

## 2019-10-25 DIAGNOSIS — R109 Unspecified abdominal pain: Secondary | ICD-10-CM

## 2019-10-25 LAB — COMPREHENSIVE METABOLIC PANEL
ALT: 20 U/L (ref 0–44)
AST: 20 U/L (ref 15–41)
Albumin: 4.5 g/dL (ref 3.5–5.0)
Alkaline Phosphatase: 52 U/L (ref 38–126)
Anion gap: 9 (ref 5–15)
BUN: 11 mg/dL (ref 6–20)
CO2: 26 mmol/L (ref 22–32)
Calcium: 9.2 mg/dL (ref 8.9–10.3)
Chloride: 101 mmol/L (ref 98–111)
Creatinine, Ser: 0.89 mg/dL (ref 0.44–1.00)
GFR, Estimated: >60 mL/min (ref >60)
Glucose, Bld: 114 mg/dL — ABNORMAL HIGH (ref 70–99)
Potassium: 3.8 mmol/L (ref 3.5–5.1)
Sodium: 136 mmol/L (ref 135–145)
Total Bilirubin: 0.6 mg/dL (ref 0.3–1.2)
Total Protein: 8.5 g/dL — ABNORMAL HIGH (ref 6.5–8.1)

## 2019-10-25 LAB — URINALYSIS, ROUTINE W REFLEX MICROSCOPIC
Bilirubin Urine: NEGATIVE
Glucose, UA: NEGATIVE mg/dL
Hgb urine dipstick: NEGATIVE
Ketones, ur: NEGATIVE mg/dL
Leukocytes,Ua: NEGATIVE
Nitrite: NEGATIVE
Protein, ur: 30 mg/dL — AB
Specific Gravity, Urine: 1.02 (ref 1.005–1.030)
pH: 8 (ref 5.0–8.0)

## 2019-10-25 LAB — CBC WITH DIFFERENTIAL/PLATELET
Abs Immature Granulocytes: 0.02 10*3/uL (ref 0.00–0.07)
Basophils Absolute: 0 10*3/uL (ref 0.0–0.1)
Basophils Relative: 0 %
Eosinophils Absolute: 0 10*3/uL (ref 0.0–0.5)
Eosinophils Relative: 0 %
HCT: 43.6 % (ref 36.0–46.0)
Hemoglobin: 14 g/dL (ref 12.0–15.0)
Immature Granulocytes: 0 %
Lymphocytes Relative: 9 %
Lymphs Abs: 0.8 10*3/uL (ref 0.7–4.0)
MCH: 29.1 pg (ref 26.0–34.0)
MCHC: 32.1 g/dL (ref 30.0–36.0)
MCV: 90.6 fL (ref 80.0–100.0)
Monocytes Absolute: 0.3 10*3/uL (ref 0.1–1.0)
Monocytes Relative: 4 %
Neutro Abs: 7.6 10*3/uL (ref 1.7–7.7)
Neutrophils Relative %: 87 %
Platelets: 353 10*3/uL (ref 150–400)
RBC: 4.81 MIL/uL (ref 3.87–5.11)
RDW: 13.5 % (ref 11.5–15.5)
WBC: 8.8 10*3/uL (ref 4.0–10.5)
nRBC: 0 % (ref 0.0–0.2)

## 2019-10-25 LAB — PREGNANCY, URINE: Preg Test, Ur: NEGATIVE

## 2019-10-25 LAB — URINALYSIS, MICROSCOPIC (REFLEX)

## 2019-10-25 LAB — LIPASE, BLOOD: Lipase: 25 U/L (ref 11–51)

## 2019-10-25 MED ORDER — MORPHINE SULFATE (PF) 4 MG/ML IV SOLN
4.0000 mg | Freq: Once | INTRAVENOUS | Status: AC
  Administered 2019-10-25: 4 mg via INTRAVENOUS
  Filled 2019-10-25: qty 1

## 2019-10-25 MED ORDER — DICLOFENAC SODIUM 75 MG PO TBEC: 75.0000 mg | DELAYED_RELEASE_TABLET | Freq: Two times a day (BID) | ORAL | 0 refills | Status: AC

## 2019-10-25 NOTE — ED Notes (Signed)
Call for consult placed with [facility]

## 2019-10-25 NOTE — ED Provider Notes (Signed)
[facility] EMERGENCY DEPARTMENT Provider Note   CSN: [id number] Arrival date & time: 10/25/19  1312     History Chief Complaint  Patient presents with  . Abdominal Pain    Darlene Ayala is a 29 y.o. female history of PCOS obesity shingles.  Patient presents today for left side and left lower quadrant pain beginning yesterday, pain was intermittent and mild at first, worsened this morning describes sharp severe constant pain nonradiating no clear aggravating or alleviating factors associated with multiple episodes of nonbloody/nonbilious emesis.  Patient took ibuprofen and Tylenol this morning with complete resolution of her pain.  Denies fever/chills, fall/injury, headache, chest pain/shortness of breath, cough/hemoptysis, hematemesis, diarrhea, melena/hematochezia, dysuria/hematuria, vaginal bleeding/discharge, concern for STI, saddle paresthesias, bowel/bladder incontinence, urinary retention, numbness/weakness, tingling or any additional concerns. HPI     Past Medical History:  Diagnosis Date  . Obesity   . PCOS (polycystic ovarian syndrome)   . Shingles   . UTI (urinary tract infection)     There are no problems to display for this patient.   History reviewed. No pertinent surgical history.   OB History   No obstetric history on file.     No family history on file.  Social History   Tobacco Use  . Smoking status: Never Smoker  . Smokeless tobacco: Never Used  Substance Use Topics  . Alcohol use: No  . Drug use: No    Home Medications Prior to Admission medications   Medication Sig Start Date End Date Taking? Authorizing Provider  diclofenac (VOLTAREN) 75 MG EC tablet Take 1 tablet (75 mg total) by mouth 2 (two) times daily. 10/25/19   Bill Salinas, PA-C    Allergies    Shellfish allergy  Review of Systems   Review of Systems Ten systems are reviewed and are negative for acute change except as noted in the HPI  Physical  Exam Updated Vital Signs BP 138/74   Pulse 84   Temp 98.3 F (36.8 C) (Oral)   Resp 18   Ht 5\' 4"  (1.626 m)   Wt 112.8 kg   LMP 09/10/2019   SpO2 100%   BMI 42.69 kg/m   Physical Exam Constitutional:      General: She is not in acute distress.    Appearance: Normal appearance. She is well-developed. She is not ill-appearing or diaphoretic.  HENT:     Head: Normocephalic and atraumatic.  Eyes:     General: Vision grossly intact. Gaze aligned appropriately.     Pupils: Pupils are equal, round, and reactive to light.  Neck:     Trachea: Trachea and phonation normal.  Pulmonary:     Effort: Pulmonary effort is normal. No respiratory distress.  Abdominal:     General: There is no distension.     Palpations: Abdomen is soft.     Tenderness: There is no abdominal tenderness. There is no guarding or rebound.  Genitourinary:    Comments: Pelvic exam deferred by patient. Musculoskeletal:        General: Normal range of motion.     Cervical back: Normal range of motion.     Comments: No midline C/T/L spinal tenderness to palpation, no paraspinal muscle tenderness, no deformity, crepitus, or step-off noted. No sign of injury to the neck or back.  Skin:    General: Skin is warm and dry.  Neurological:     Mental Status: She is alert.     GCS: GCS eye subscore is 4. GCS verbal subscore  is 5. GCS motor subscore is 6.     Comments: Speech is clear and goal oriented, follows commands Major Cranial nerves without deficit, no facial droop Moves extremities without ataxia, coordination intact  Psychiatric:        Behavior: Behavior normal.    ED Results / Procedures / Treatments   Labs (all labs ordered are listed, but only abnormal results are displayed) Labs Reviewed  URINALYSIS, ROUTINE W REFLEX MICROSCOPIC - Abnormal; Notable for the following components:      Result Value   Protein, ur 30 (*)    All other components within normal limits  COMPREHENSIVE METABOLIC PANEL -  Abnormal; Notable for the following components:   Glucose, Bld 114 (*)    Total Protein 8.5 (*)    All other components within normal limits  URINALYSIS, MICROSCOPIC (REFLEX) - Abnormal; Notable for the following components:   Bacteria, UA MANY (*)    All other components within normal limits  PREGNANCY, URINE  CBC WITH DIFFERENTIAL/PLATELET  LIPASE, BLOOD    EKG None  Radiology CT Renal Stone Study  Result Date: 10/25/2019 CLINICAL DATA:  29 year old female with right flank pain. EXAM: CT ABDOMEN AND PELVIS WITHOUT CONTRAST TECHNIQUE: Multidetector CT imaging of the abdomen and pelvis was performed following the standard protocol without IV contrast. COMPARISON:  None. FINDINGS: Lower chest: No acute abnormality. Hepatobiliary: No focal liver abnormality is seen. No gallstones, gallbladder wall thickening, or biliary dilatation. Pancreas: Unremarkable. No pancreatic ductal dilatation or surrounding inflammatory changes. Spleen: Normal in size without focal abnormality. Adrenals/Urinary Tract: Adrenal glands are unremarkable. Kidneys are normal, without renal calculi, focal lesion, or hydronephrosis. Bladder is unremarkable. Stomach/Bowel: Stomach is within normal limits. Appendix appears normal. No evidence of bowel wall thickening, distention, or inflammatory changes. Vascular/Lymphatic: No significant vascular findings are present. No enlarged abdominal or pelvic lymph nodes. Reproductive: Just superior to the bladder and likely within the right adnexa is a solid and cystic mass with a few peripheral skin scattered punctate calcifications and internal cystic portion with fat density in total measuring approximately 8.1 x 5.6 x 5.8 cm. The uterus is present unremarkable. The left adnexa is within normal limits. Other: No abdominal wall hernia or abnormality. No abdominopelvic ascites. Musculoskeletal: No acute or significant osseous findings. 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 No evidence of rupture. Recommend pelvic ultrasound for further evaluation for possible ovarian torsion in addition to Gynecologic consultation. These results were called by telephone at the time of interpretation on 10/25/2019 at 3:27 pm to provider [facility] , who verbally acknowledged these results. Marliss Coots, MD [facility] Radiology Electronically Signed   By: Marliss Coots MD   On: 10/25/2019 15:31   US PELVIC COMPLETE W TRANSVAGINAL AND TORSION R/O  Result Date: 10/25/2019 CLINICAL DATA:  Right ovarian teratoma EXAM: TRANSABDOMINAL AND TRANSVAGINAL ULTRASOUND OF PELVIS DOPPLER ULTRASOUND OF OVARIES TECHNIQUE: Both transabdominal and transvaginal ultrasound examinations of the pelvis were performed. Transabdominal technique was performed for global imaging of the pelvis including uterus, ovaries, adnexal regions, and pelvic cul-de-sac. It was necessary to proceed with endovaginal exam following the transabdominal exam to visualize the uterus, endometrium, ovaries and adnexa. Color and duplex Doppler ultrasound was utilized to evaluate blood flow to the ovaries. COMPARISON:  CT earlier today FINDINGS: Uterus Measurements: 7.0 x 2.3 x 3.9 cm = volume: 33 mL. No fibroids or other mass visualized. Endometrium Thickness: 4 mm in thickness.  No focal abnormality visualized. Right ovary Measurements: 4.2 x 2.3 x 2.7 cm = volume: 13.4 mL. Normal appearance/no adnexal mass. Left ovary Measurements: 8.0 x 6.4 x 8.5 cm complex echogenic mass replacing much of the left ovary compatible with large dermoid. Pulsed Doppler evaluation of both ovaries demonstrates normal low-resistance arterial and venous waveforms in the right ovary. Venous blood flow can be documented in the tissue surrounding the dermoid on the left. Arterial waveforms cannot be definitively visualized. Other findings No abnormal free fluid.  IMPRESSION: 8 cm echogenic mass replacing much of the left ovary compatible with dermoid. Venous blood flow is visible in the ovarian tissue around the mass. Arterial flow not definitively visualized or documented, likely technical given the venous blood flow seen. Electronically Signed   By: Charlett NoseKevin  Dover M.D.   On: 10/25/2019 17:31    Procedures Procedures (including critical care time)  Medications Ordered in ED Medications  morphine 4 MG/ML injection 4 mg (4 mg Intravenous Given 10/25/19 1645)    ED Course  I have reviewed the triage vital signs and the nursing notes.  Pertinent labs & imaging results that were available during my care of the patient were reviewed by me and considered in my medical decision making (see chart for details).    MDM Rules/Calculators/A&P                         Additional history obtained from: 1. Nursing notes from this visit. 2. Electronic medical record review. ---------------------------- I ordered, reviewed and interpreted labs which include: CBC without looks ptosis to suggest infection, no anemia.   CMP shows no emergent electrolyte derangement, AKI, LFT elevations or gap. Lipase within normal limits, doubt pancreatitis. Urinalysis shows some protein, no nitrites or leukocytes, does show bacteria but also several squamous cell she has no urinary symptoms doubt UTI appears contaminated. Urine pregnancy test negative.  Doubt ectopic.  CT renal stone study:  IMPRESSION:  Findings compatible with right adnexal mature cystic teratoma  measuring up to 8.1 cm. No evidence of rupture.    Recommend pelvic ultrasound for further evaluation for possible  ovarian torsion in addition to Gynecologic consultation.   Pelvic ultrasound with flow:  IMPRESSION:  8 cm echogenic mass replacing much of the left ovary compatible with  dermoid. Venous blood flow is visible in the ovarian tissue around  the mass. Arterial flow not definitively visualized or  documented,  likely technical given the venous blood flow seen.  - 5:53 PM: Discussed case and findings above with OB/GYN Dr. Shawnie PonsPratt, advised inconsistent with ovarian torsion patient may be discharged with outpatient OB/GYN follow-up for eventual removal of the cyst pain control with diclofenac or Mobic. --- Patient was reevaluated she is resting comfortably in bed no pain she states understanding of plan of care and findings as above she has no further questions or concerns.  She does not have an OB/GYN she will be referred to [facility] for future treatment.  She denies any history gastric ulcers or CKD, she was prescribed diclofenac.  Possible patient's pain today related to the cyst, lower suspicion for kidney stone but possible patient may have passed on earlier.  She deferred pelvic examination citing no concern for STI so lower suspicion for PID but patient states understanding of limited exam without pelvic exam today.  She has no back pain no evidence for cauda equina or other emergent pathologies at this time.  At this time there does not appear to be any evidence of an acute emergency medical condition and the patient appears stable for discharge with appropriate outpatient follow up. Diagnosis was discussed with patient who verbalizes understanding of care plan and is agreeable to discharge. I have discussed return precautions with patient who verbalizes understanding. Patient encouraged to follow-up with their PCP and OB/GYN. All questions answered.  Patient's case discussed with Dr. Adela Lank who agrees with plan to discharge with follow-up.   Note: Portions of this report may have been transcribed using voice recognition software. Every effort was made to ensure accuracy; however, inadvertent computerized transcription errors may still be present. Final Clinical Impression(s) / ED Diagnoses Final diagnoses:  Abdominal pain, unspecified abdominal location   Cyst of ovary, unspecified laterality    Rx / DC Orders ED Discharge Orders         Ordered    diclofenac (VOLTAREN) 75 MG EC tablet  2 times daily        10/25/19 1841           Bill Salinas, PA-C 10/25/19 1845    Melene Plan, DO 10/26/19 0008

## 2019-10-25 NOTE — ED Triage Notes (Signed)
Nausea last night.  This morning has LLQ pain and vomiting.

## 2019-10-25 NOTE — Discharge Instructions (Signed)
At this time there does not appear to be the presence of an emergent medical condition, however there is always the potential for conditions to change. Please read and follow the below instructions.  Please return to the Emergency Department immediately for any new or worsening symptoms. Please be sure to follow up with your Primary Care Provider within one week regarding your visit today; please call their office to schedule an appointment even if you are feeling better for a follow-up visit. [facility] on your [facility] paperwork for further evaluation and treatment of your cyst. You may use the medication Voltaren as prescribed to help with your pain.  Take this medication with food to avoid stomach ache.  Please drink plenty of water and get plenty of rest.  Do not take Voltaren with other nonsteroidal anti-inflammatory drugs such as ibuprofen.  Go to the nearest Emergency Department immediately if: You have fever or chills You have belly pain that is very bad or gets worse. You cannot eat or drink without throwing up (vomiting). You suddenly get a fever. Your period is a lot heavier than usual. You have nausea or vomiting You have abnormal vaginal discharge or other concern for a sexually transmitted disease You have any new/concerning or worsening of symptoms.  Please read the additional information packets attached to your discharge summary.  Do not take your medicine if  develop an itchy rash, swelling in your mouth or lips, or difficulty breathing; call 911 and seek immediate emergency medical attention if this occurs.  You may review your lab tests and imaging results in their entirety on your MyChart account.  Please discuss all results of fully with your primary care provider and other specialist at your follow-up visit.  Note: Portions of this text may have been transcribed using voice recognition software. Every effort was made to ensure accuracy; however,  inadvertent computerized transcription errors may still be present.

## 2019-10-25 NOTE — ED Notes (Signed)
Pt calling her boyfriend to get a ride from hospital upon discharge.

## 2020-11-24 IMAGING — CT CT RENAL STONE PROTOCOL
2 of 4 series · 16 of 46 positions shown, 18 images · non-contrast
Comparison: None.

CLINICAL DATA: 29-year-old female with right flank pain.

EXAM:
CT ABDOMEN AND PELVIS WITHOUT CONTRAST
TECHNIQUE: Multidetector CT imaging of the abdomen and pelvis was performed
following the standard protocol without IV contrast.

[Series 2: axial st · axial · 0.81mm/px · z∈[-350,+60]mm · 13 of 90 slices shown, 15 images]
[im 4/90  soft-tissue]
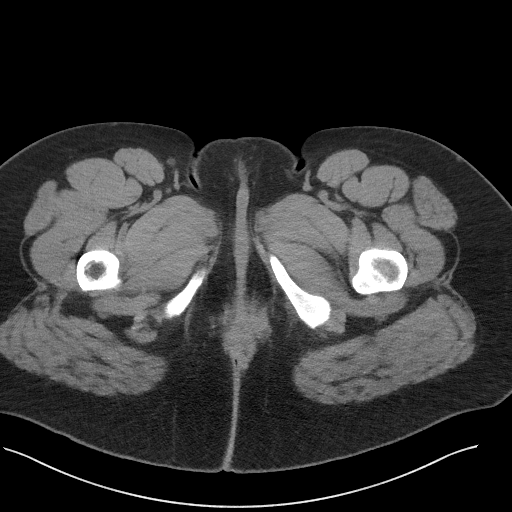
[im 4/90  bone]
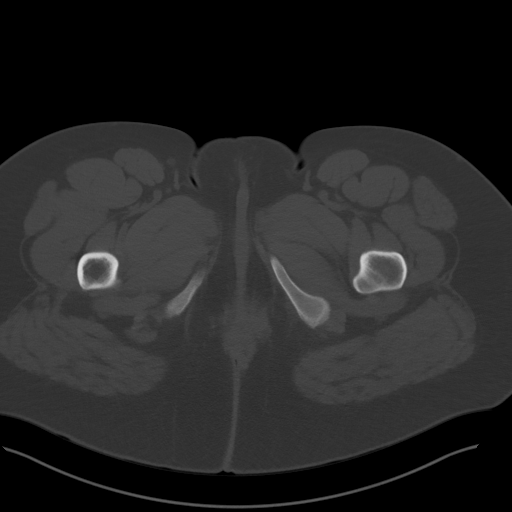
[im 11/90  soft-tissue]
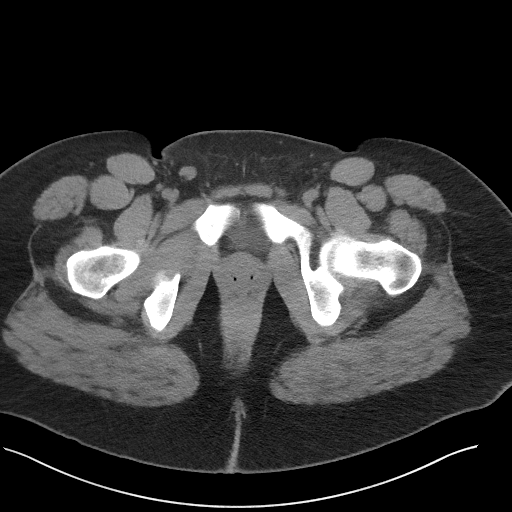
[im 18/90  soft-tissue]
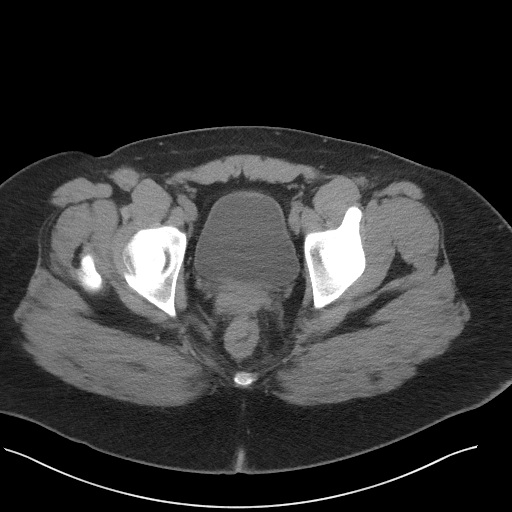
[im 25/90  soft-tissue]
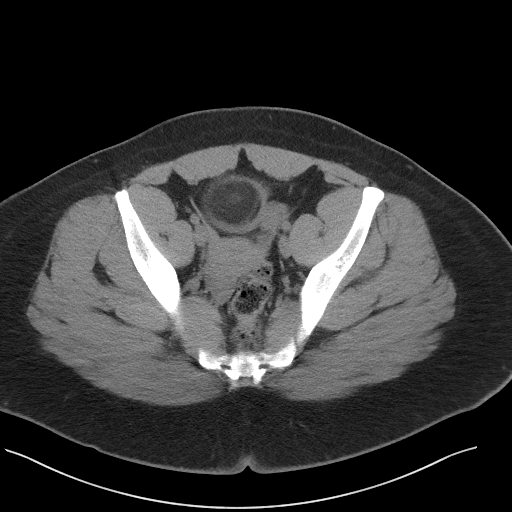
[im 33/90  soft-tissue]
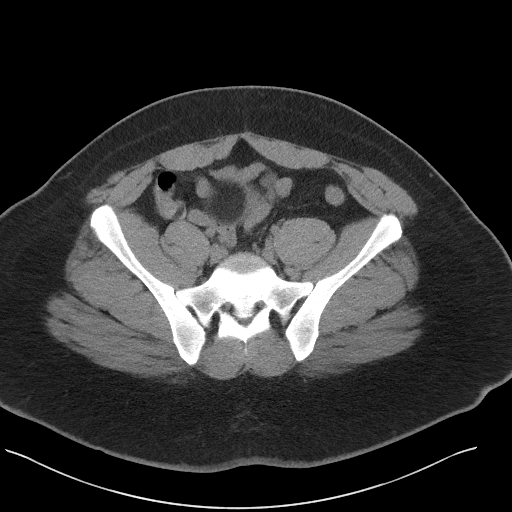
[im 40/90  soft-tissue]
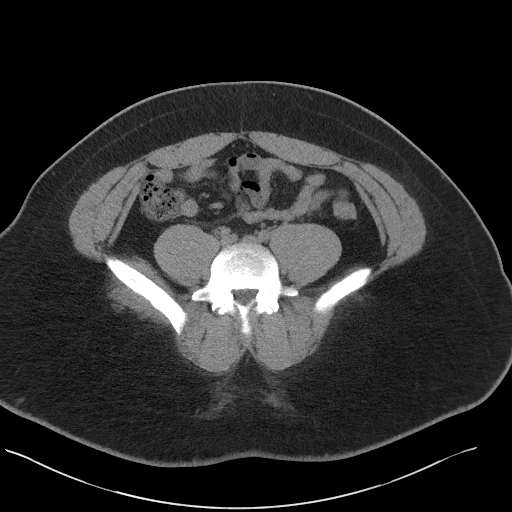
[im 47/90  soft-tissue]
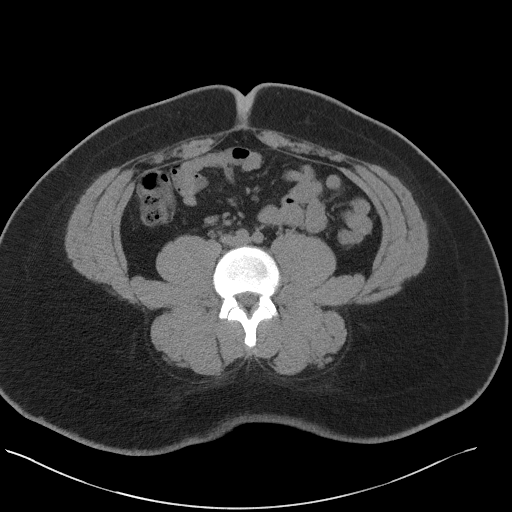
[im 50/90  soft-tissue]
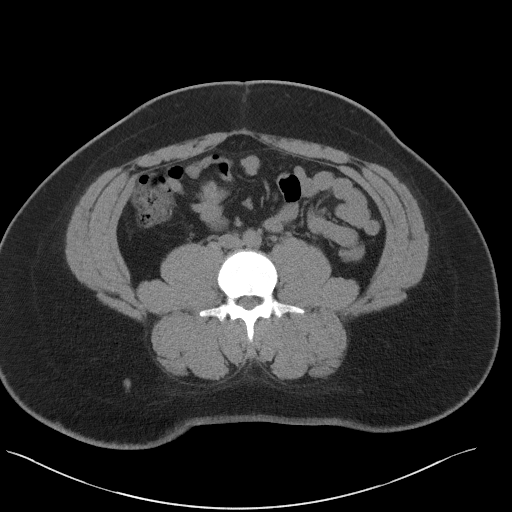
[im 57/90  soft-tissue]
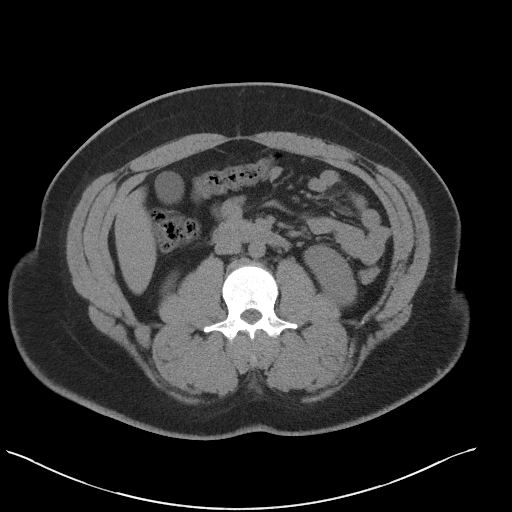
[im 57/90  bone]
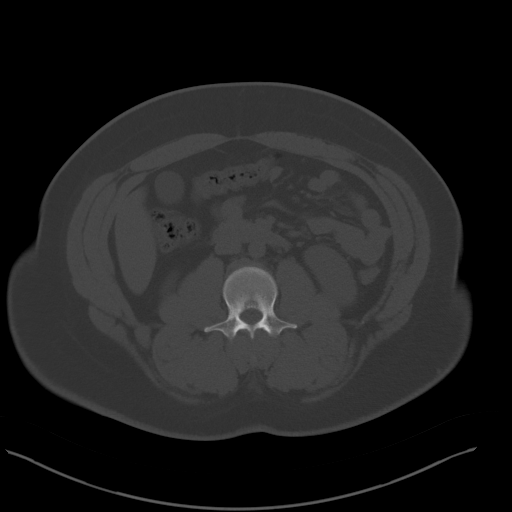
[im 65/90  soft-tissue]
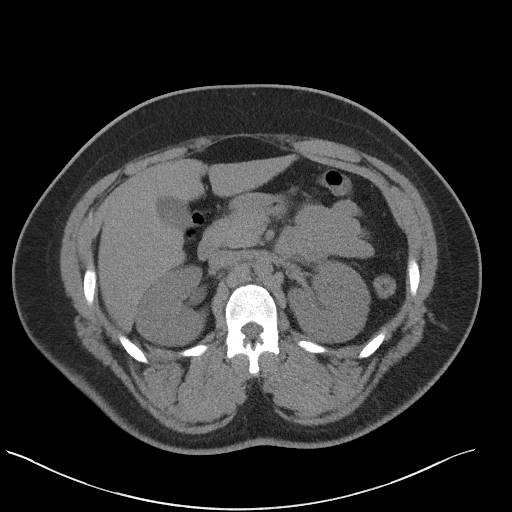
[im 72/90  soft-tissue]
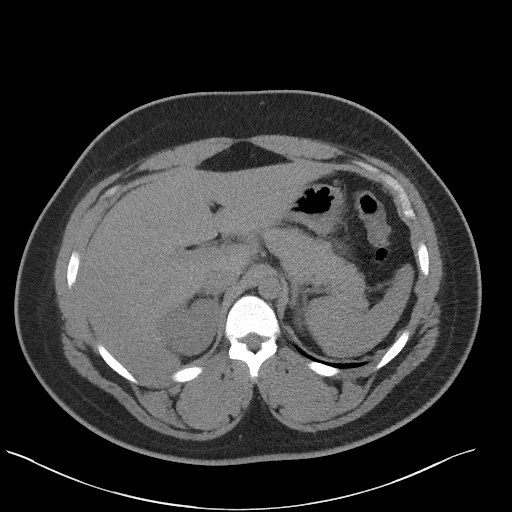
[im 79/90  soft-tissue]
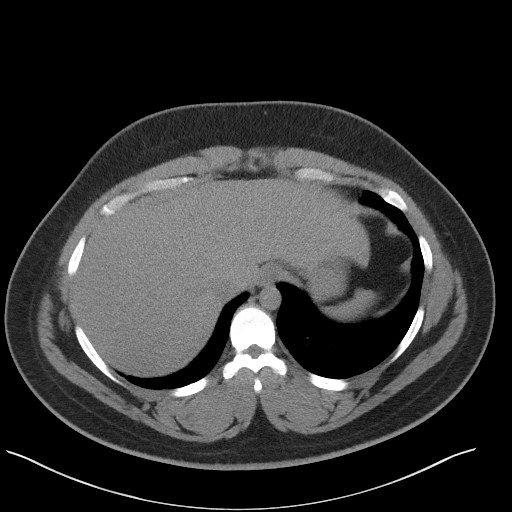
[im 86/90  soft-tissue]
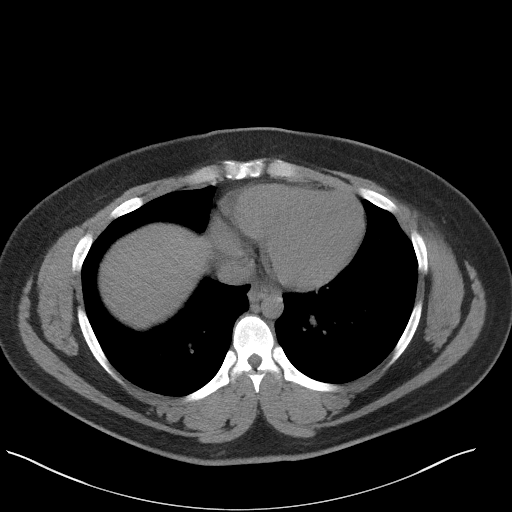

[Series 5: coronal st · coronal · 0.94mm/px · 3 of 115 slices shown]
[im 39/115  soft-tissue]
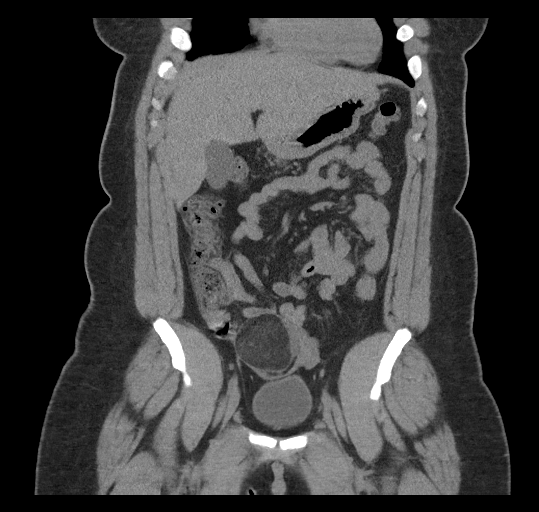
[im 51/115  soft-tissue]
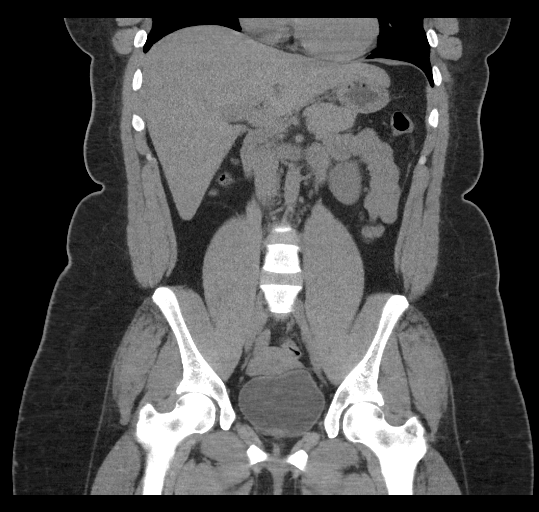
[im 64/115  soft-tissue]
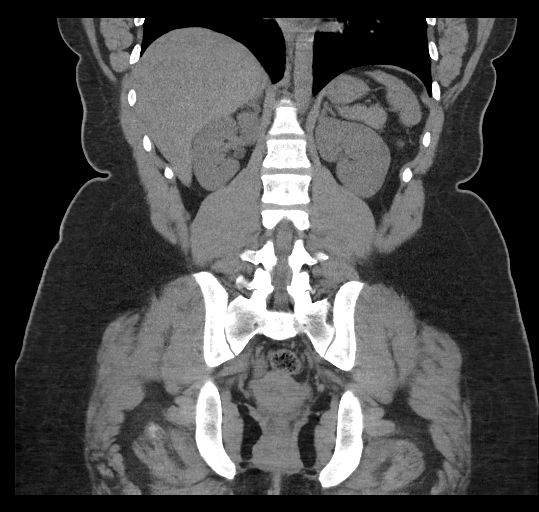

[16 of 46 positions shown; findings below may reference images not displayed]

FINDINGS: Lower chest: No acute abnormality.

Hepatobiliary: No focal liver abnormality is seen. No gallstones,
gallbladder wall thickening, or biliary dilatation.

Pancreas: Unremarkable. No pancreatic ductal dilatation or
surrounding inflammatory changes.

Spleen: Normal in size without focal abnormality.

Adrenals/Urinary Tract: Adrenal glands are unremarkable. Kidneys are
normal, without renal calculi, focal lesion, or hydronephrosis.
Bladder is unremarkable.

Stomach/Bowel: Stomach is within normal limits. Appendix appears
normal. No evidence of bowel wall thickening, distention, or
inflammatory changes.

Vascular/Lymphatic: No significant vascular findings are present. No
enlarged abdominal or pelvic lymph nodes.

Reproductive: Just superior to the bladder and likely within the
right adnexa is a solid and cystic mass with a few peripheral skin
scattered punctate calcifications and internal cystic portion with
fat density in total measuring approximately 8.1 x 5.6 x 5.8 cm. The
uterus is present unremarkable. The left adnexa is within normal
limits.

Other: No abdominal wall hernia or abnormality. No abdominopelvic
ascites.

Musculoskeletal: No acute or significant osseous findings.
IMPRESSION: Findings compatible with right adnexal mature cystic teratoma
measuring up to 8.1 cm. No evidence of rupture.

Recommend pelvic ultrasound for further evaluation for possible
ovarian torsion in addition to Gynecologic consultation.

These results were called by telephone at the time of interpretation
on 10/25/2019 at [DATE] to provider ICXAK HAG , who verbally
acknowledged these results.
